# Patient Record
Sex: Female | Born: 1995
Health system: Southern US, Community
[De-identification: ages and names within clinical notes are randomized; demographics above are authoritative.]

## PROBLEM LIST (undated history)

## (undated) DIAGNOSIS — Z973 Presence of spectacles and contact lenses: Secondary | ICD-10-CM

## (undated) DIAGNOSIS — F419 Anxiety disorder, unspecified: Secondary | ICD-10-CM

## (undated) DIAGNOSIS — K8010 Calculus of gallbladder with chronic cholecystitis without obstruction: Secondary | ICD-10-CM

## (undated) DIAGNOSIS — F32A Depression, unspecified: Secondary | ICD-10-CM

## (undated) HISTORY — PX: NO PAST SURGERIES: SHX2092

## (undated) HISTORY — PX: CHOLECYSTECTOMY: SHX55

---
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
## 2019-01-30 ENCOUNTER — Ambulatory Visit: Payer: BLUE CROSS/BLUE SHIELD | Admitting: Family

## 2019-02-16 ENCOUNTER — Other Ambulatory Visit: Payer: Self-pay

## 2019-02-17 ENCOUNTER — Ambulatory Visit: Payer: 59 | Admitting: Family

## 2019-02-17 ENCOUNTER — Encounter: Payer: Self-pay | Admitting: Family

## 2019-02-17 VITALS — BP 128/89 | HR 82 | Temp 98.5°F | Ht 63.0 in | Wt 132.4 lb

## 2019-02-17 DIAGNOSIS — F411 Generalized anxiety disorder: Secondary | ICD-10-CM | POA: Diagnosis not present

## 2019-02-17 DIAGNOSIS — Z0000 Encounter for general adult medical examination without abnormal findings: Secondary | ICD-10-CM

## 2019-02-17 DIAGNOSIS — F330 Major depressive disorder, recurrent, mild: Secondary | ICD-10-CM

## 2019-02-17 MED ORDER — ESCITALOPRAM OXALATE 10 MG PO TABS: 10.0000 mg | ORAL_TABLET | Freq: Every day | ORAL | 3 refills | Status: DC

## 2019-02-17 NOTE — Progress Notes (Signed)
 Subjective:    Patient ID: Denise Davila, female    DOB: 04/05/1996, 23 y.o.   MRN: [id number]  Chief Complaint  Patient presents with  . New Patient (Initial Visit)   PT presents to the office today for CPE. It has been several years since we have seen each other. She states it has been a year and half since she took Lexapro. She states she stopped it because shew as doing well, but over the last few months she has noticed her anxiety has increased and would like to restart.  Anxiety Presents for follow-up visit. Symptoms include decreased concentration, depressed mood, excessive worry, insomnia, irritability, nervous/anxious behavior, panic and restlessness. Symptoms occur most days. The severity of symptoms is moderate.    Depression        This is a chronic problem.  The current episode started more than 1 year ago.   The onset quality is gradual.   The problem occurs intermittently.  The problem has been waxing and waning since onset.  Associated symptoms include decreased concentration, insomnia, irritable, restlessness, decreased interest and sad.  Associated symptoms include no helplessness and no hopelessness.  Past treatments include nothing.  Compliance with treatment is good.  Past medical history includes anxiety.       Review of Systems  Constitutional: Positive for irritability.  Psychiatric/Behavioral: Positive for decreased concentration and depression. The patient is nervous/anxious and has insomnia.   All other systems reviewed and are negative.      Family History  Problem Relation Age of Onset  . Hyperlipidemia Father     Social History   Socioeconomic History  . Marital status: Single    Spouse name: Not on file  . Number of children: Not on file  . Years of education: Not on file  . Highest education level: Not on file  Occupational History  . Not on file  Social Needs  . Financial resource strain: Not on file  . Food insecurity    Worry: Not on  file    Inability: Not on file  . Transportation needs    Medical: Not on file    Non-medical: Not on file  Tobacco Use  . Smoking status: Never Smoker  Substance and Sexual Activity  . Alcohol use: No    Alcohol/week: 0.0 standard drinks  . Drug use: No  . Sexual activity: Not on file  Lifestyle  . Physical activity    Days per week: Not on file    Minutes per session: Not on file  . Stress: Not on file  Relationships  . Social connections    Talks on phone: Not on file    Gets together: Not on file    Attends religious service: Not on file    Active member of club or organization: Not on file    Attends meetings of clubs or organizations: Not on file    Relationship status: Not on file  Other Topics Concern  . Not on file  Social History Narrative  . Not on file    Objective:   Physical Exam Vitals signs reviewed.  Constitutional:      General: She is irritable. She is not in acute distress.    Appearance: She is well-developed.  HENT:     Head: Normocephalic and atraumatic.     Right Ear: Tympanic membrane normal.     Left Ear: Tympanic membrane normal.  Eyes:     Pupils: Pupils are equal, round, and reactive   to light.  Neck:     Musculoskeletal: Normal range of motion and neck supple.     Thyroid: No thyromegaly.  Cardiovascular:     Rate and Rhythm: Normal rate and regular rhythm.     Heart sounds: Normal heart sounds. No murmur.  Pulmonary:     Effort: Pulmonary effort is normal. No respiratory distress.     Breath sounds: Normal breath sounds. No wheezing.  Abdominal:     General: Bowel sounds are normal. There is no distension.     Palpations: Abdomen is soft.     Tenderness: There is no abdominal tenderness.  Musculoskeletal: Normal range of motion.        General: No tenderness.  Skin:    General: Skin is warm and dry.  Neurological:     Mental Status: She is alert and oriented to person, place, and time.     Cranial Nerves: No cranial nerve  deficit.     Deep Tendon Reflexes: Reflexes are normal and symmetric.  Psychiatric:        Behavior: Behavior normal.        Thought Content: Thought content normal.        Judgment: Judgment normal.     BP 128/89   Pulse 82   Temp 98.5 F (36.9 C) (Oral)   Ht 5' 3" (1.6 m)   Wt 132 lb 6.4 oz (60.1 kg)   BMI 23.45 kg/m      Assessment & Plan:  Denise Davila comes in today with chief complaint of New Patient (Initial Visit) (dizzy spells )   Diagnosis and orders addressed:  1. GAD (generalized anxiety disorder) -Will restart patient on Lexapro 10 mg today Stress management discussed Possible adverse effects discussed  - CMP14+EGFR - CBC with Differential/Platelet - escitalopram (LEXAPRO) 10 MG tablet; Take 1 tablet (10 mg total) by mouth daily.  Dispense: 90 tablet; Refill: 3  2. Mild episode of recurrent major depressive disorder (HCC) - CMP14+EGFR - CBC with Differential/Platelet - escitalopram (LEXAPRO) 10 MG tablet; Take 1 tablet (10 mg total) by mouth daily.  Dispense: 90 tablet; Refill: 3  3. Annual physical exam - CMP14+EGFR - CBC with Differential/Platelet - TSH - Lipid panel   Labs pending Health Maintenance reviewed Diet and exercise encouraged  Follow up plan: 4 weeks    Christy Hawks, FNP  

## 2019-02-17 NOTE — Patient Instructions (Signed)

## 2019-02-18 LAB — CBC WITH DIFFERENTIAL/PLATELET
Basophils Absolute: 0.1 10*3/uL (ref 0.0–0.2)
Basos: 1 %
EOS (ABSOLUTE): 0.1 10*3/uL (ref 0.0–0.4)
Eos: 1 %
Hematocrit: 38.4 % (ref 34.0–46.6)
Hemoglobin: 13.3 g/dL (ref 11.1–15.9)
Immature Grans (Abs): 0 10*3/uL (ref 0.0–0.1)
Immature Granulocytes: 0 %
Lymphocytes Absolute: 2.2 10*3/uL (ref 0.7–3.1)
Lymphs: 20 %
MCH: 31.6 pg (ref 26.6–33.0)
MCHC: 34.6 g/dL (ref 31.5–35.7)
MCV: 91 fL (ref 79–97)
Monocytes Absolute: 0.9 10*3/uL (ref 0.1–0.9)
Monocytes: 8 %
Neutrophils Absolute: 7.5 10*3/uL — ABNORMAL HIGH (ref 1.4–7.0)
Neutrophils: 70 %
Platelets: 298 10*3/uL (ref 150–450)
RBC: 4.21 x10E6/uL (ref 3.77–5.28)
RDW: 11.8 % (ref 11.7–15.4)
WBC: 10.8 10*3/uL (ref 3.4–10.8)

## 2019-02-18 LAB — CMP14+EGFR
ALT: 16 IU/L (ref 0–32)
AST: 17 IU/L (ref 0–40)
Albumin/Globulin Ratio: 1.8 (ref 1.2–2.2)
Albumin: 5.1 g/dL — ABNORMAL HIGH (ref 3.9–5.0)
Alkaline Phosphatase: 61 IU/L (ref 39–117)
BUN/Creatinine Ratio: 16 (ref 9–23)
BUN: 12 mg/dL (ref 6–20)
Bilirubin Total: 0.3 mg/dL (ref 0.0–1.2)
CO2: 21 mmol/L (ref 20–29)
Calcium: 9.7 mg/dL (ref 8.7–10.2)
Chloride: 99 mmol/L (ref 96–106)
Creatinine, Ser: 0.73 mg/dL (ref 0.57–1.00)
GFR calc Af Amer: 136 mL/min/{1.73_m2} (ref >59)
GFR calc non Af Amer: 118 mL/min/{1.73_m2} (ref >59)
Globulin, Total: 2.8 g/dL (ref 1.5–4.5)
Glucose: 88 mg/dL (ref 65–99)
Potassium: 4.2 mmol/L (ref 3.5–5.2)
Sodium: 140 mmol/L (ref 134–144)
Total Protein: 7.9 g/dL (ref 6.0–8.5)

## 2019-02-18 LAB — TSH: TSH: 1.5 u[IU]/mL (ref 0.450–4.500)

## 2019-02-18 LAB — LIPID PANEL
Chol/HDL Ratio: 2.1 ratio (ref 0.0–4.4)
Cholesterol, Total: 156 mg/dL (ref 100–199)
HDL: 74 mg/dL (ref >39)
LDL Calculated: 75 mg/dL (ref 0–99)
Triglycerides: 35 mg/dL (ref 0–149)
VLDL Cholesterol Cal: 7 mg/dL (ref 5–40)

## 2019-03-29 ENCOUNTER — Other Ambulatory Visit: Payer: Self-pay

## 2019-03-30 ENCOUNTER — Ambulatory Visit: Payer: 59 | Admitting: Family

## 2019-03-30 ENCOUNTER — Encounter: Payer: Self-pay | Admitting: Family

## 2019-03-30 VITALS — BP 113/75 | HR 72 | Temp 98.6°F | Ht 63.0 in | Wt 129.0 lb

## 2019-03-30 DIAGNOSIS — F411 Generalized anxiety disorder: Secondary | ICD-10-CM | POA: Diagnosis not present

## 2019-03-30 DIAGNOSIS — F330 Major depressive disorder, recurrent, mild: Secondary | ICD-10-CM

## 2019-03-30 MED ORDER — ESCITALOPRAM OXALATE 20 MG PO TABS: 20.0000 mg | ORAL_TABLET | Freq: Every day | ORAL | 5 refills | Status: DC

## 2019-03-30 MED ORDER — BUSPIRONE HCL 7.5 MG PO TABS: 7.5000 mg | ORAL_TABLET | Freq: Three times a day (TID) | ORAL | 1 refills | Status: DC

## 2019-03-30 NOTE — Progress Notes (Signed)
Subjective:    Patient ID: Denise Davila, female    DOB: 1995/12/29, 23 y.o.   MRN: [id number]  Chief Complaint  Patient presents with  . Anxiety    recheck   Pt presents to the office today to recheck GAD and depression. She was seen on 02/17/19 and was started on Lexapro 10 mg. She states she has not seen a difference. She reports she has increased her drinking since feeling so anxious and depressed. She reports drinking about a case a week.  Anxiety Presents for follow-up visit. Symptoms include decreased concentration, depressed mood, excessive worry, irritability, nervous/anxious behavior and restlessness. Patient reports no suicidal ideas. Symptoms occur most days. The severity of symptoms is moderate. The quality of sleep is good.    Depression        This is a chronic problem.  The current episode started more than 1 year ago.   The onset quality is gradual.   The problem occurs intermittently.  Associated symptoms include decreased concentration, helplessness, hopelessness, irritable, restlessness, decreased interest, appetite change and sad.  Associated symptoms include no suicidal ideas.  Past treatments include SSRIs - Selective serotonin reuptake inhibitors.  Past medical history includes anxiety.       Review of Systems  Constitutional: Positive for appetite change and irritability.  Psychiatric/Behavioral: Positive for decreased concentration and depression. Negative for suicidal ideas. The patient is nervous/anxious.   All other systems reviewed and are negative.      Objective:   Physical Exam Vitals signs reviewed.  Constitutional:      General: She is irritable. She is not in acute distress.    Appearance: She is well-developed.  HENT:     Head: Normocephalic and atraumatic.     Right Ear: Tympanic membrane normal.     Left Ear: Tympanic membrane normal.  Eyes:     Pupils: Pupils are equal, round, and reactive to light.  Neck:     Musculoskeletal: Normal  range of motion and neck supple.     Thyroid: No thyromegaly.  Cardiovascular:     Rate and Rhythm: Normal rate and regular rhythm.     Heart sounds: Normal heart sounds. No murmur.  Pulmonary:     Effort: Pulmonary effort is normal. No respiratory distress.     Breath sounds: Normal breath sounds. No wheezing.  Abdominal:     General: Bowel sounds are normal. There is no distension.     Palpations: Abdomen is soft.     Tenderness: There is no abdominal tenderness.  Musculoskeletal: Normal range of motion.        General: No tenderness.  Skin:    General: Skin is warm and dry.  Neurological:     Mental Status: She is alert and oriented to person, place, and time.     Cranial Nerves: No cranial nerve deficit.     Deep Tendon Reflexes: Reflexes are normal and symmetric.  Psychiatric:        Mood and Affect: Affect is flat.        Behavior: Behavior normal.        Thought Content: Thought content normal.        Judgment: Judgment normal.     BP 113/75   Pulse 72   Temp 98.6 F (37 C) (Temporal)   Ht 5\' 3"  (1.6 m)   Wt 129 lb (58.5 kg)   LMP 03/16/2019   BMI 22.85 kg/m      Assessment & Plan:  [employer]  BARABRA TIMP comes in today with chief complaint of Anxiety (recheck)   Diagnosis and orders addressed:  1. Mild episode of recurrent major depressive disorder (HCC) - escitalopram (LEXAPRO) 20 MG tablet; Take 1 tablet (20 mg total) by mouth daily.  Dispense: 30 tablet; Refill: 5 - busPIRone (BUSPAR) 7.5 MG tablet; Take 1 tablet (7.5 mg total) by mouth 3 (three) times daily.  Dispense: 90 tablet; Refill: 1  2. GAD (generalized anxiety disorder) - escitalopram (LEXAPRO) 20 MG tablet; Take 1 tablet (20 mg total) by mouth daily.  Dispense: 30 tablet; Refill: 5 - busPIRone (BUSPAR) 7.5 MG tablet; Take 1 tablet (7.5 mg total) by mouth 3 (three) times daily.  Dispense: 90 tablet; Refill: 1  Will increase Lexapro to 20 mg from 10 mg Start Buspar Discussed decreasing her alcohol  intake and then stopping completely. Her father is an alcoholic  PT wants to hold off on [place] referral at this time RTO in 4 weeks   Evelina Dun, FNP

## 2019-03-30 NOTE — Patient Instructions (Signed)

## 2019-04-14 ENCOUNTER — Other Ambulatory Visit: Payer: Self-pay

## 2019-04-17 ENCOUNTER — Ambulatory Visit: Payer: 59 | Admitting: Family

## 2019-04-19 ENCOUNTER — Encounter: Payer: Self-pay | Admitting: Family

## 2020-06-18 ENCOUNTER — Encounter (HOSPITAL_COMMUNITY): Payer: Self-pay | Admitting: Emergency Medicine

## 2020-06-18 ENCOUNTER — Emergency Department (HOSPITAL_COMMUNITY)
Admission: EM | Admit: 2020-06-18 | Discharge: 2020-06-18 | Disposition: A | Discharge status: Home / Self Care | Payer: 59 | Attending: Emergency Medicine | Admitting: Emergency Medicine

## 2020-06-18 ENCOUNTER — Other Ambulatory Visit: Payer: Self-pay

## 2020-06-18 DIAGNOSIS — J029 Acute pharyngitis, unspecified: Secondary | ICD-10-CM | POA: Diagnosis not present

## 2020-06-18 DIAGNOSIS — Z20822 Contact with and (suspected) exposure to covid-19: Secondary | ICD-10-CM | POA: Insufficient documentation

## 2020-06-18 DIAGNOSIS — R059 Cough, unspecified: Secondary | ICD-10-CM | POA: Diagnosis present

## 2020-06-18 DIAGNOSIS — J209 Acute bronchitis, unspecified: Secondary | ICD-10-CM | POA: Insufficient documentation

## 2020-06-18 LAB — RESP PANEL BY RT-PCR (FLU A&B, COVID) ARPGX2
Influenza A by PCR: NEGATIVE
Influenza B by PCR: NEGATIVE
SARS Coronavirus 2 by RT PCR: NEGATIVE

## 2020-06-18 LAB — GROUP A STREP BY PCR: Group A Strep by PCR: NOT DETECTED

## 2020-06-18 MED ORDER — AZITHROMYCIN 250 MG PO TABS: 250.0000 mg | ORAL_TABLET | Freq: Every day | ORAL | 0 refills | Status: DC

## 2020-06-18 NOTE — Discharge Instructions (Addendum)
You were seen in the emerge department for evaluation of cough sore throat and congestion in the setting of a Covid exposure.  Your Covid and flu testing were negative.  Your rapid strep test was negative.  We are putting you on some antibiotics for possible acute bronchitis.  Please drink plenty of fluids.  Follow-up with your regular primary care doctor.  Return to the emergency department for any worsening or concerning symptoms.

## 2020-06-18 NOTE — ED Provider Notes (Signed)
[facility] DEPT Provider Note   CSN: [id number] Arrival date & time: 06/18/20  1521     History Chief Complaint  Patient presents with  . Covid Exposure    Denise Davila is a 24 y.o. female.  She is complaining of 6 days of sore throat cough chest congestion.  No fever.  Her boyfriend tested positive for Covid and she has not been isolating from him.  She is not Covid vaccinated.  The history is provided by the patient.  Influenza Presenting symptoms: cough, rhinorrhea, shortness of breath and sore throat   Presenting symptoms: no fever and no headaches   Severity:  Moderate Onset quality:  Gradual Duration:  6 days Progression:  Unchanged Chronicity:  New Relieved by:  None tried Worsened by:  Nothing Ineffective treatments:  None tried Associated symptoms: nasal congestion   Associated symptoms: no mental status change and no neck stiffness   Risk factors: sick contacts        History reviewed. No pertinent past medical history.  Patient Active Problem List   Diagnosis Date Noted  . GAD (generalized anxiety disorder) 08/22/2015  . Depression 08/22/2015    History reviewed. No pertinent surgical history.   OB History   No obstetric history on file.     Family History  Problem Relation Age of Onset  . Hyperlipidemia Father     Social History   Tobacco Use  . Smoking status: Never Smoker  . Smokeless tobacco: Never Used  Vaping Use  . Vaping Use: Never used  Substance Use Topics  . Alcohol use: No    Alcohol/week: 0.0 standard drinks  . Drug use: No    Home Medications Prior to Admission medications   Medication Sig Start Date End Date Taking? Authorizing Provider  busPIRone (BUSPAR) 7.5 MG tablet Take 1 tablet (7.5 mg total) by mouth 3 (three) times daily. 03/30/19   Evelina Dun A, FNP  escitalopram (LEXAPRO) 20 MG tablet Take 1 tablet (20 mg total) by mouth daily. 03/30/19   Sharion Balloon, FNP    Allergies     Patient has no known allergies.  Review of Systems   Review of Systems  Constitutional: Negative for fever.  HENT: Positive for congestion, rhinorrhea, sore throat and voice change.   Eyes: Negative for visual disturbance.  Respiratory: Positive for cough and shortness of breath.   Cardiovascular: Negative for chest pain.  Gastrointestinal: Negative for abdominal pain.  Genitourinary: Negative for dysuria.  Musculoskeletal: Negative for neck stiffness.  Skin: Negative for rash.  Neurological: Negative for headaches.    Physical Exam Updated Vital Signs BP 125/79 (BP Location: Left Arm)   Pulse 69   Temp 98.2 F (36.8 C) (Oral)   Resp 16   SpO2 100%   Physical Exam Vitals and nursing note reviewed.  Constitutional:      General: She is not in acute distress.    Appearance: Normal appearance. She is well-developed.  HENT:     Head: Normocephalic and atraumatic.     Mouth/Throat:     Mouth: Mucous membranes are moist.     Pharynx: Oropharynx is clear. No oropharyngeal exudate.  Eyes:     Conjunctiva/sclera: Conjunctivae normal.  Cardiovascular:     Rate and Rhythm: Normal rate and regular rhythm.     Heart sounds: No murmur heard.   Pulmonary:     Effort: Pulmonary effort is normal. No respiratory distress.     Breath sounds: Normal breath sounds.  Abdominal:     Palpations: Abdomen is soft.     Tenderness: There is no abdominal tenderness.  Musculoskeletal:        General: No deformity. Normal range of motion.     Cervical back: Neck supple.  Skin:    General: Skin is warm and dry.     Capillary Refill: Capillary refill takes less than 2 seconds.  Neurological:     General: No focal deficit present.     Mental Status: She is alert.     ED Results / Procedures / Treatments   Labs (all labs ordered are listed, but only abnormal results are displayed) Labs Reviewed  GROUP A STREP BY PCR  RESP PANEL BY RT-PCR (FLU A&B, COVID) [id number]     EKG None  Radiology No results found.  Procedures Procedures (including critical care time)  Medications Ordered in ED Medications - No data to display  ED Course  I have reviewed the triage vital signs and the nursing notes.  Pertinent labs & imaging results that were available during my care of the patient were reviewed by me and considered in my medical decision making (see chart for details).    MDM Rules/Calculators/A&P                         Denise Davila was evaluated in Emergency Department on 06/18/2020 for the symptoms described in the history of present illness. She was evaluated in the context of the global COVID-19 pandemic, which necessitated consideration that the patient might be at risk for infection with the SARS-CoV-2 virus that causes COVID-19. Institutional protocols and algorithms that pertain to the evaluation of patients at risk for COVID-19 are in a state of rapid change based on information released by regulatory bodies including the CDC and federal and state organizations. These policies and algorithms were followed during the patient's care in the ED.  Differential diagnosis includes Covid, bronchitis, pneumonia, PE, reactive airway, strep throat.  Patient's Covid flu and rapid strep are negative.  Vital signs unremarkable and PERC negative.  Will treat for possible bronchitis with antibiotics.  Return instructions discussed Final Clinical Impression(s) / ED Diagnoses Final diagnoses:  Pharyngitis, unspecified etiology  Acute bronchitis, unspecified organism    Rx / DC Orders ED Discharge Orders         Ordered    azithromycin (ZITHROMAX) 250 MG tablet  Daily        06/18/20 1831           Hayden Rasmussen, MD 06/19/20 0930

## 2020-06-18 NOTE — ED Triage Notes (Signed)
Per pt, states she has a sore throat and cough for a couple of days-states her BF tested positive 2 weeks ago-has not been isolating self from BF

## 2020-09-05 ENCOUNTER — Encounter: Payer: Self-pay | Admitting: Family Medicine

## 2020-09-05 ENCOUNTER — Other Ambulatory Visit: Payer: Self-pay

## 2020-09-05 ENCOUNTER — Ambulatory Visit: Payer: 59 | Admitting: Family Medicine

## 2020-09-05 VITALS — BP 115/79 | HR 86 | Temp 99.1°F | Ht 63.0 in | Wt 126.4 lb

## 2020-09-05 DIAGNOSIS — K8020 Calculus of gallbladder without cholecystitis without obstruction: Secondary | ICD-10-CM | POA: Diagnosis not present

## 2020-09-05 DIAGNOSIS — R1011 Right upper quadrant pain: Secondary | ICD-10-CM

## 2020-09-05 NOTE — Patient Instructions (Signed)
Gallbladder Eating Plan If you have a gallbladder condition, you may have trouble digesting fats. Eating a low-fat diet can help reduce your symptoms, and may be helpful before and after having surgery to remove your gallbladder (cholecystectomy). Your health care provider may recommend that you work with a diet and nutrition specialist (dietitian) to help you reduce the amount of fat in your diet. What are tips for following this plan? General guidelines  Limit your fat intake to less than 30% of your total daily calories. If you eat around 1,800 calories each day, this is less than 60 grams (g) of fat per day.  Fat is an important part of a healthy diet. Eating a low-fat diet can make it hard to maintain a healthy body weight. Ask your dietitian how much fat, calories, and other nutrients you need each day.  Eat small, frequent meals throughout the day instead of three large meals.  Drink at least 8-10 cups of fluid a day. Drink enough fluid to keep your urine clear or pale yellow.  Limit alcohol intake to no more than 1 drink a day for nonpregnant women and 2 drinks a day for men. One drink equals 12 oz of beer, 5 oz of wine, or 1 oz of hard liquor. Reading food labels  Check Nutrition Facts on food labels for the amount of fat per serving. Choose foods with less than 3 grams of fat per serving.   Shopping  Choose nonfat and low-fat healthy foods. Look for the words "nonfat," "low fat," or "fat free."  Avoid buying processed or prepackaged foods. Cooking  Cook using low-fat methods, such as baking, broiling, grilling, or boiling.  Cook with small amounts of healthy fats, such as olive oil, grapeseed oil, canola oil, or sunflower oil. What foods are recommended?  All fresh, frozen, or canned fruits and vegetables.  Whole grains.  Low-fat or non-fat (skim) milk and yogurt.  Lean meat, skinless poultry, fish, eggs, and beans.  Low-fat protein supplement powders or  drinks.  Spices and herbs. What foods are not recommended?  High-fat foods. These include baked goods, fast food, fatty cuts of meat, ice cream, french toast, sweet rolls, pizza, cheese bread, foods covered with butter, creamy sauces, or cheese.  Fried foods. These include french fries, tempura, battered fish, breaded chicken, fried breads, and sweets.  Foods with strong odors.  Foods that cause bloating and gas. Summary  A low-fat diet can be helpful if you have a gallbladder condition, or before and after gallbladder surgery.  Limit your fat intake to less than 30% of your total daily calories. This is about 60 g of fat if you eat 1,800 calories each day.  Eat small, frequent meals throughout the day instead of three large meals. This information is not intended to replace advice given to you by your health care provider. Make sure you discuss any questions you have with your health care provider. Document Revised: 02/29/2020 Document Reviewed: 02/29/2020 Elsevier Patient Education  2021 [employer]  

## 2020-09-05 NOTE — Progress Notes (Signed)
Established Patient Office Visit  Subjective:  Patient ID: Denise Davila, female    DOB: Jan 01, 1996  Age: 25 y.o. MRN: [id number]  CC:  Chief Complaint  Patient presents with  . Abdominal Pain    Right upper quadrant     HPI [employer] presents for abdominal pain. She has been to the ED x3 for this since January. She reports a history of gall stones and had an US done in the ED on 08/02/20. Gall stones were noted but no evidence of cholelithiasis on [place]. She was referred to general surgery but has received a phone call regarding this. She has been prescribed pepcid, zofran, protonix, and carafate for gastritis as well. These have been helpful. She drinks a case of beer and a 1/5th of liquor a week. She denies vomiting, diarrhea, fever, blood in her stool, weakness, or dizziness. She has been trying to watch her diet. Labs were completed in the ED on 09/02/20 and were unremarkable. She denies a change in symptoms since then.    History reviewed. No pertinent past medical history.  History reviewed. No pertinent surgical history.  Family History  Problem Relation Age of Onset  . Hyperlipidemia Father     Social History   Socioeconomic History  . Marital status: Single    Spouse name: Not on file  . Number of children: 0  . Years of education: Not on file  . Highest education level: Not on file  Occupational History  . Occupation: Hospital doctor    Comment: [place] [employer]  Tobacco Use  . Smoking status: Never Smoker  . Smokeless tobacco: Never Used  Vaping Use  . Vaping Use: Every day  Substance and Sexual Activity  . Alcohol use: Yes    Alcohol/week: 24.0 standard drinks    Types: 24 Cans of beer per week    Comment: 5th of liquor  . Drug use: No  . Sexual activity: Not on file  Other Topics Concern  . Not on file  Social History Narrative  . Not on file   Social Determinants of Health   Financial Resource Strain: Not on file  Food Insecurity: Not on file   Transportation Needs: Not on file  Physical Activity: Not on file  Stress: Not on file  Social Connections: Not on file  Intimate Partner Violence: Not on file    Outpatient Medications Prior to Visit  Medication Sig Dispense Refill  . famotidine (PEPCID) 20 MG tablet Take by mouth.    . ondansetron (ZOFRAN-ODT) 4 MG disintegrating tablet Take by mouth.    . pantoprazole (PROTONIX) 40 MG tablet Take by mouth.    . sucralfate (CARAFATE) 1 g tablet Take by mouth.    Marland Kitchen azithromycin (ZITHROMAX) 250 MG tablet Take 1 tablet (250 mg total) by mouth daily. Take first 2 tablets together, then 1 every day until finished. 6 tablet 0  . busPIRone (BUSPAR) 7.5 MG tablet Take 1 tablet (7.5 mg total) by mouth 3 (three) times daily. 90 tablet 1  . escitalopram (LEXAPRO) 20 MG tablet Take 1 tablet (20 mg total) by mouth daily. 30 tablet 5   No facility-administered medications prior to visit.    No Known Allergies  ROS Review of Systems As per HPI.   Objective:    Physical Exam Vitals and nursing note reviewed.  Constitutional:      General: She is not in acute distress.    Appearance: She is well-developed. She is not ill-appearing, toxic-appearing or  diaphoretic.  Eyes:     General: No scleral icterus. Cardiovascular:     Rate and Rhythm: Normal rate and regular rhythm.     Heart sounds: Normal heart sounds. No murmur heard.   Pulmonary:     Effort: Pulmonary effort is normal. No respiratory distress.     Breath sounds: Normal breath sounds.  Abdominal:     General: Abdomen is flat. There is no distension.     Palpations: Abdomen is soft. There is no shifting dullness, fluid wave, hepatomegaly, splenomegaly, mass or pulsatile mass.     Tenderness: There is abdominal tenderness. There is no right CVA tenderness, left CVA tenderness, guarding or rebound. Positive signs include Murphy's sign. Negative signs include Rovsing's sign and McBurney's sign.  Skin:    General: Skin is warm  and dry.     Coloration: Skin is not jaundiced.  Neurological:     General: No focal deficit present.     Mental Status: She is alert and oriented to person, place, and time.  Psychiatric:        Mood and Affect: Mood normal.        Behavior: Behavior normal.     BP 115/79   Pulse 86   Temp 99.1 F (37.3 C)   Ht 5\' 3"  (1.6 m)   Wt 126 lb 6.4 oz (57.3 kg)   SpO2 100%   BMI 22.39 kg/m  Wt Readings from Last 3 Encounters:  09/05/20 126 lb 6.4 oz (57.3 kg)  03/30/19 129 lb (58.5 kg)  02/17/19 132 lb 6.4 oz (60.1 kg)     Health Maintenance Due  Topic Date Due  . Hepatitis C Screening  Never done  . HIV Screening  Never done  . PAP-Cervical Cytology Screening  Never done  . PAP SMEAR-Modifier  Never done    There are no preventive care reminders to display for this patient.  Lab Results  Component Value Date   TSH 1.500 02/17/2019   Lab Results  Component Value Date   WBC 10.8 02/17/2019   HGB 13.3 02/17/2019   HCT 38.4 02/17/2019   MCV 91 02/17/2019   PLT 298 02/17/2019   Lab Results  Component Value Date   NA 140 02/17/2019   K 4.2 02/17/2019   CO2 21 02/17/2019   GLUCOSE 88 02/17/2019   BUN 12 02/17/2019   CREATININE 0.73 02/17/2019   BILITOT 0.3 02/17/2019   ALKPHOS 61 02/17/2019   AST 17 02/17/2019   ALT 16 02/17/2019   PROT 7.9 02/17/2019   ALBUMIN 5.1 (H) 02/17/2019   CALCIUM 9.7 02/17/2019   Lab Results  Component Value Date   CHOL 156 02/17/2019   Lab Results  Component Value Date   HDL 74 02/17/2019   Lab Results  Component Value Date   LDLCALC 75 02/17/2019   Lab Results  Component Value Date   TRIG 35 02/17/2019   Lab Results  Component Value Date   CHOLHDL 2.1 02/17/2019   No results found for: HGBA1C    Assessment & Plan:   Denise Davila was seen today for abdominal pain.  Diagnoses and all orders for this visit:  Right upper quadrant pain [place] on 08/02/20 in ED showed gall stones without evidence of cholelithiasis. Referral  was placed by ED but patient has not received notification for this. Labs in ED on 09/02/20 were unremarkable. Continue Pepcid, protonix, and carafate as prescribed. Discussed reduction in alcohol. New referral placed. Return to office for new or worsening  symptoms, or if symptoms persist.  -     Ambulatory referral to [facility] stones New referral to General Surgery placed for patient. Diet handout given.  -     Ambulatory referral to General Surgery  Follow-up: Return if symptoms worsen or fail to improve.   The patient indicates understanding of these issues and agrees with the plan.  Gwenlyn Perking, FNP

## 2020-10-29 ENCOUNTER — Ambulatory Visit: Payer: Self-pay | Admitting: Surgery

## 2020-10-29 DIAGNOSIS — K8010 Calculus of gallbladder with chronic cholecystitis without obstruction: Secondary | ICD-10-CM | POA: Insufficient documentation

## 2020-10-29 NOTE — H&P (Signed)
Denise Davila Appointment: 10/29/2020 11:00 AM Location: [facility] Patient #: [id number] DOB: [birth year] Single / Language: Cleophus Molt / Race: White Female  History of Present Illness Denise Hector MD; 10/29/2020 12:41 PM) The patient is a 25 year old female who presents for evaluation of gall stones. Note for "Gall stones": ` ` ` Patient sent for surgical consultation at the request of Marjorie Smolder, NP  Chief Complaint: Upper abdominal pain with gallstones. ` ` The patient is a patient is had intermittent episodes of upper abdominal pain. Has gone to the peripheral emergency room several times. Had an ultrasound that revealed gallstones in the description but in the assessments is no evidence. I cannot pull up the films. Without cholecystitis. Moderate alcohol intake as a trigger as well. Follow-up with primary care. Suspicion for possible cholecystitis and not just alcoholic gastritis. Surgical consultation offered.  Patient comes in today with her significant other. She describes pain in the right upper quadrant. Usually triggered by eating. Occasionally spicy foods. Worse with the onion rings. She does have some moderate alcohol intake but does not know if that triggered things. She is gone to the emergency room 3 times. She's never had any prior abdominal surgery. She can walk several miles without difficulty. No sleep apnea. No diabetes. No tobacco. She has been given Carafate, Pepcid, Protonix. I don't know how compliant she's been on it but it sounds like she's been taking medications for several weeks. She noted a little less discomfort but still with repeated attacks. Denies any heartburn and supine or bending over. No burping or belching. No early satiety. No history of prior ulcerations. Does not take frequent doses of nonsteroidals. She has some moderate alcohol intake but no heavy binging.  No personal nor family history of GI/colon cancer,  inflammatory bowel disease, irritable bowel syndrome, allergy such as Celiac Sprue, dietary/dairy problems, colitis, ulcers nor gastritis. No recent sick contacts/gastroenteritis. No travel outside the country. No changes in diet. No dysphagia to solids or liquids. No significant heartburn or reflux. No melena, hematemesis, coffee ground emesis. No evidence of prior gastric/peptic ulceration.  (Review of systems as stated in this history (HPI) or in the review of systems. Otherwise all other 12 point ROS are negative) ` ` ###########################################`  IMPRESSION:  No evidence for cholelithiasis.   Electronically Signed by: Zettie Pho  Narrative Performed by [phone] <epic:EPIC?[id number],DXR,[id number],DXR&#14;narrative&#[id number]> COMPARISON: None.  INDICATION: Right Upper Quadrant Pain  TECHNIQUE: [place] RUQ  FINDINGS:  Aorta: Unremarkable.  Inferior vena cava: Unremarkable.  Pancreas: Partially visualized.  Liver: Unremarkable echogenicity. No suspicious hepatic mass identified. Normal size.  Portal vein: Flow is hepatopedal.  Gallbladder: Mildly distended gallbladder with gallstones. Gallbladder wall thickness 2.3 mm. Negative pericholecystic fluid. Indeterminate sonographic Murphy's sign.  Biliary ducts: Common Bile Duct 3.1 mm.  Right kidney: No hydronephrosis. 9.6 cm  Other: NA  Procedure Note  Zettie Pho, MD - 08/02/2020 Formatting of this note might be different from the original. COMPARISON: None.  INDICATION: Right Upper Quadrant Pain  TECHNIQUE: [place] RUQ  FINDINGS:  Aorta: Unremarkable.  Inferior vena cava: Unremarkable.  Pancreas: Partially visualized.  Liver: Unremarkable echogenicity. No suspicious hepatic mass identified. Normal size.  Portal vein: Flow is hepatopedal.  Gallbladder: Mildly distended gallbladder with gallstones. Gallbladder  wall thickness 2.3 mm. Negative pericholecystic fluid. Indeterminate sonographic Murphy's sign.  Biliary ducts: Common Bile Duct 3.1 mm.  Right kidney: No hydronephrosis. 9.6 cm  Other: NA    IMPRESSION:  No evidence for cholelithiasis.  Electronically Signed by: Zettie Pho Exam End: 08/02/20 6:14 AM Specimen Collected: 08/02/20 6:31 AM       This patient encounter took 35 minutes today to perform the following: obtain history, perform exam, review outside records, interpret tests & imaging, counsel the patient on their diagnosis; and, document this encounter, including findings & plan in the electronic health record (EHR).   Past Surgical History Janeann Forehand, CNA; 10/29/2020 11:01 AM) No pertinent past surgical history  Diagnostic Studies History Janeann Forehand, CNA; 10/29/2020 11:01 AM) Colonoscopy never Pap Smear never  Allergies Janeann Forehand, CNA; 10/29/2020 11:01 AM) No Known Drug Allergies [10/29/2020]: Allergies Reconciled  Medication History Janeann Forehand, CNA; 10/29/2020 11:02 AM) Famotidine (20MG  Tablet, Oral) Active. Sucralfate (1GM Tablet, Oral) Active. Pantoprazole Sodium (40MG  Tablet DR, Oral) Active. Medications Reconciled  Social History Janeann Forehand, CNA; 10/29/2020 11:01 AM) Alcohol use Moderate alcohol use. Caffeine use Carbonated beverages. No drug use Tobacco use Former smoker.  Family History Janeann Forehand, CNA; 10/29/2020 11:01 AM) First Degree Relatives No pertinent family history  Pregnancy / Birth History Janeann Forehand, CNA; 10/29/2020 11:01 AM) Age at menarche 61 years. Gravida 0 Para 0 Regular periods  Other Problems Janeann Forehand, CNA; 10/29/2020 11:01 AM) Anxiety Disorder     Review of Systems (Donyelle Alston CNA; 10/29/2020 11:01 AM) General Not Present- Appetite Loss, Chills, Fatigue, Fever, Night Sweats, Weight Gain and Weight Loss. HEENT Present- Wears glasses/contact  lenses. Not Present- Earache, Hearing Loss, Hoarseness, Nose Bleed, Oral Ulcers, Ringing in the Ears, Seasonal Allergies, Sinus Pain, Sore Throat, Visual Disturbances and Yellow Eyes. Respiratory Not Present- Bloody sputum, Chronic Cough, Difficulty Breathing, Snoring and Wheezing. Breast Not Present- Breast Mass, Breast Pain, Nipple Discharge and Skin Changes. Cardiovascular Not Present- Chest Pain, Difficulty Breathing Lying Down, Leg Cramps, Palpitations, Rapid Heart Rate, Shortness of Breath and Swelling of Extremities. Gastrointestinal Not Present- Abdominal Pain, Bloating, Bloody Stool, Change in Bowel Habits, Chronic diarrhea, Constipation, Difficulty Swallowing, Excessive gas, Gets full quickly at meals, Hemorrhoids, Indigestion, Nausea, Rectal Pain and Vomiting. Female Genitourinary Not Present- Frequency, Nocturia, Painful Urination, Pelvic Pain and Urgency. Musculoskeletal Not Present- Back Pain, Joint Pain, Joint Stiffness, Muscle Pain, Muscle Weakness and Swelling of Extremities. Neurological Not Present- Decreased Memory, Fainting, Headaches, Numbness, Seizures, Tingling, Tremor, Trouble walking and Weakness. Endocrine Not Present- Cold Intolerance, Excessive Hunger, Hair Changes, Heat Intolerance, Hot flashes and New Diabetes.  Vitals (Donyelle Alston CNA; 10/29/2020 11:02 AM) 10/29/2020 11:02 AM Weight: 121.5 lb Height: 63in Body Surface Area: 1.56 m Body Mass Index: 21.52 kg/m  Temp.: 98.24F  Pulse: 87 (Regular)  P.OX: 98% (Room air) BP: 118/80(Sitting, Left Arm, Standard)        Physical Exam Denise Hector MD; 10/29/2020 12:36 PM)  General Mental Status-Alert. General Appearance-Not in acute distress, Not Sickly. Orientation-Oriented X3. Hydration-Well hydrated. Voice-Normal. Note: Thin woman. Not cachectic. Rather stoic. Not toxic. Not sickly.  Integumentary Global Assessment Upon inspection and palpation of skin surfaces of the - Axillae:  non-tender, no inflammation or ulceration, no drainage. and Distribution of scalp and body hair is normal. General Characteristics Temperature - normal warmth is noted.  Head and Neck Head-normocephalic, atraumatic with no lesions or palpable masses. Face Global Assessment - atraumatic, no absence of expression. Neck Global Assessment - no abnormal movements, no bruit auscultated on the right, no bruit auscultated on the left, no decreased range of motion, non-tender. Trachea-midline. Thyroid Gland Characteristics - non-tender.  Eye Eyeball - Left-Extraocular movements intact, No Nystagmus - Left. Eyeball - Right-Extraocular movements  intact, No Nystagmus - Right. Cornea - Left-No Hazy - Left. Cornea - Right-No Hazy - Right. Sclera/Conjunctiva - Left-No scleral icterus, No Discharge - Left. Sclera/Conjunctiva - Right-No scleral icterus, No Discharge - Right. Pupil - Left-Direct reaction to light normal. Pupil - Right-Direct reaction to light normal.  ENMT Ears Pinna - Left - no drainage observed, no generalized tenderness observed. Pinna - Right - no drainage observed, no generalized tenderness observed. Nose and Sinuses External Inspection of the Nose - no destructive lesion observed. Inspection of the nares - Left - quiet respiration. Inspection of the nares - Right - quiet respiration. Mouth and Throat Lips - Upper Lip - no fissures observed, no pallor noted. Lower Lip - no fissures observed, no pallor noted. Nasopharynx - no discharge present. Oral Cavity/Oropharynx - Tongue - no dryness observed. Oral Mucosa - no cyanosis observed. Hypopharynx - no evidence of airway distress observed.  Chest and Lung Exam Inspection Movements - Normal and Symmetrical. Accessory muscles - No use of accessory muscles in breathing. Palpation Palpation of the chest reveals - Non-tender. Auscultation Breath sounds - Normal and Clear.  Cardiovascular Auscultation Rhythm -  Regular. Murmurs & Other Heart Sounds - Auscultation of the heart reveals - No Murmurs and No Systolic Clicks.  Abdomen Inspection Inspection of the abdomen reveals - No Visible peristalsis and No Abnormal pulsations. Umbilicus - No Bleeding, No Urine drainage. Palpation/Percussion Palpation and Percussion of the abdomen reveal - Soft, Non Tender, No Rebound tenderness, No Rigidity (guarding) and No Cutaneous hyperesthesia. Note: Abdomen soft. Mild discomfort right upper quadrant at anterior surgery line. No Murphy sign. No epigastric or upper abdominal discomfort elsewhere. No renal mass. No hepatomegaly. No splenomegaly.  Not distended. No diastasis recti. No umbilical or other anterior abdominal wall hernias  Female Genitourinary Sexual Maturity Tanner 5 - Adult hair pattern. Note: No vaginal bleeding nor discharge  Peripheral Vascular Upper Extremity Inspection - Left - No Cyanotic nailbeds - Left, Not Ischemic. Inspection - Right - No Cyanotic nailbeds - Right, Not Ischemic.  Neurologic Neurologic evaluation reveals -normal attention span and ability to concentrate, able to name objects and repeat phrases. Appropriate fund of knowledge , normal sensation and normal coordination. Mental Status Affect - not angry, not paranoid. Cranial Nerves-Normal Bilaterally. Gait-Normal.  Neuropsychiatric Mental status exam performed with findings of-able to articulate well with normal speech/language, rate, volume and coherence, thought content normal with ability to perform basic computations and apply abstract reasoning and no evidence of hallucinations, delusions, obsessions or homicidal/suicidal ideation.  Musculoskeletal Global Assessment Spine, Ribs and Pelvis - no instability, subluxation or laxity. Right Upper Extremity - no instability, subluxation or laxity.  Lymphatic Head & Neck  General Head & Neck Lymphatics: Bilateral - Description - No Localized  lymphadenopathy. Axillary  General Axillary Region: Bilateral - Description - No Localized lymphadenopathy. Femoral & Inguinal  Generalized Femoral & Inguinal Lymphatics: Left - Description - No Localized lymphadenopathy. Right - Description - No Localized lymphadenopathy.    Assessment & Plan Denise Hector MD; 10/29/2020 12:38 PM)  CHRONIC CHOLECYSTITIS WITH CALCULUS (K80.10) Impression: Rather classic story of biliary colic with ultrasound noting stones in the report itself. The rest of the differential diagnosis seems unlikely. No real improvement in symptoms with compliance on antacid regimen and diet adjustment for the past few weeks.  I think she would benefit from cholecystectomy. Reasonable outpatient surgery candidate. Single site approach. She and her significant other are interested in proceeding. I think her risks of surgery are low.  The biggest risk is that it does not solve her problem there are some other etiology for her pain and symptoms. However the differential diagnosis seems underwhelming. I am skeptical and recommending gastroenterology evaluation in the absence of any other GI symptoms. She has gone to the ER 3 times with this, no improvement with medications for gastritis/antacids., so she wishes to be aggressive and proceed with surgery to break the cycle of attacks  Current Plans You are being scheduled for surgery- Our schedulers will call you.  You should hear from our office's scheduling department within 5 working days about the location, date, and time of surgery. We try to make accommodations for patient's preferences in scheduling surgery, but sometimes the OR schedule or the surgeon's schedule prevents [place] from making those accommodations.  If you have not heard from our office [phone]) in 5 working days, call the office and ask for your surgeon's nurse.  If you have other questions about your diagnosis, plan, or surgery, call the office and ask for  your surgeon's nurse.  Written instructions provided Pt Education - Pamphlet Given - Laparoscopic Gallbladder Surgery: discussed with patient and provided information. The anatomy & physiology of hepatobiliary & pancreatic function was discussed. The pathophysiology of gallbladder dysfunction was discussed. Natural history risks without surgery was discussed. I feel the risks of no intervention will lead to serious problems that outweigh the operative risks; therefore, I recommended cholecystectomy to remove the pathology. I explained laparoscopic techniques with possible need for an open approach. Probable cholangiogram to evaluate the bilary tract was explained as well.  Risks such as bleeding, infection, abscess, leak, injury to other organs, need for further treatment, heart attack, death, and other risks were discussed. I noted a good likelihood this will help address the problem. Possibility that this will not correct all abdominal symptoms was explained. Goals of post-operative recovery were discussed as well. We will work to minimize complications. An educational handout further explaining the pathology and treatment options was given as well. Questions were answered. The patient expresses understanding & wishes to proceed with surgery.  Pt Education - [facility] Laparosopic Post Op HCI (Silvana Holecek) Pt Education - [facility] Good Bowel Health (Kelina Beauchamp) Pt Education - Laparoscopic Cholecystectomy: gallbladder  Denise Hector, MD, FACS, MASCRS  Esophageal, Gastrointestinal & Colorectal Surgery Robotic and Minimally Invasive Surgery [facility] [address] [place], [place] [address] [phone] Fax [phone] Main/Paging  CONTACT INFORMATION: Weekday (9AM-5PM) concerns: Call [facility] main office at [phone] Weeknight (5PM-9AM) or Weekend/Holiday concerns: Check [email or web address] for General Surgery [facility] coverage (Please, do not use SecureChat as it is not  reliable communication to operating surgeons for immediate patient care)

## 2020-10-29 NOTE — H&P (View-Only) (Signed)
Denise Davila Appointment: 10/29/2020 11:00 AM Location: [facility] Patient #: [id number] DOB: 10/29/1995 Single / Language: Denise Davila / Race: White Female  History of Present Illness Denise Hector MD; 10/29/2020 12:41 PM) The patient is a 25 year old female who presents for evaluation of gall stones. Note for "Gall stones": ` ` ` Patient sent for surgical consultation at the request of Denise Smolder, NP  Chief Complaint: Upper abdominal pain with gallstones. ` ` The patient is a patient is had intermittent episodes of upper abdominal pain. Has gone to the peripheral emergency room several times. Had an ultrasound that revealed gallstones in the description but in the assessments is no evidence. I cannot pull up the films. Without cholecystitis. Moderate alcohol intake as a trigger as well. Follow-up with primary care. Suspicion for possible cholecystitis and not just alcoholic gastritis. Surgical consultation offered.  Patient comes in today with her significant other. She describes pain in the right upper quadrant. Usually triggered by eating. Occasionally spicy foods. Worse with the onion rings. She does have some moderate alcohol intake but does not know if that triggered things. She is gone to the emergency room 3 times. She's never had any prior abdominal surgery. She can walk several miles without difficulty. No sleep apnea. No diabetes. No tobacco. She has been given Carafate, Pepcid, Protonix. I don't know how compliant she's been on it but it sounds like she's been taking medications for several weeks. She noted a little less discomfort but still with repeated attacks. Denies any heartburn and supine or bending over. No burping or belching. No early satiety. No history of prior ulcerations. Does not take frequent doses of nonsteroidals. She has some moderate alcohol intake but no heavy binging.  No personal nor family history of GI/colon cancer,  inflammatory bowel disease, irritable bowel syndrome, allergy such as Celiac Sprue, dietary/dairy problems, colitis, ulcers nor gastritis. No recent sick contacts/gastroenteritis. No travel outside the country. No changes in diet. No dysphagia to solids or liquids. No significant heartburn or reflux. No melena, hematemesis, coffee ground emesis. No evidence of prior gastric/peptic ulceration.  (Review of systems as stated in this history (HPI) or in the review of systems. Otherwise all other 12 point ROS are negative) ` ` ###########################################`  IMPRESSION:  No evidence for cholelithiasis.   Electronically Signed by: Denise Davila  Narrative Performed by [phone] <epic:EPIC?REPORT&MR_[id number]&11^[id number],DXR,[id number],DXR&#14;narrative&#14;[id number]> COMPARISON: None.  INDICATION: Right Upper Quadrant Pain  TECHNIQUE: [place] RUQ  FINDINGS:  Aorta: Unremarkable.  Inferior vena cava: Unremarkable.  Pancreas: Partially visualized.  Liver: Unremarkable echogenicity. No suspicious hepatic mass identified. Normal size.  Portal vein: Flow is hepatopedal.  Gallbladder: Mildly distended gallbladder with gallstones. Gallbladder wall thickness 2.3 mm. Negative pericholecystic fluid. Indeterminate sonographic Murphy's sign.  Biliary ducts: Common Bile Duct 3.1 mm.  Right kidney: No hydronephrosis. 9.6 cm  Other: NA  Procedure Note  Denise Pho, MD - 08/02/2020 Formatting of this note might be different from the original. COMPARISON: None.  INDICATION: Right Upper Quadrant Pain  TECHNIQUE: [place] RUQ  FINDINGS:  Aorta: Unremarkable.  Inferior vena cava: Unremarkable.  Pancreas: Partially visualized.  Liver: Unremarkable echogenicity. No suspicious hepatic mass identified. Normal size.  Portal vein: Flow is hepatopedal.  Gallbladder: Mildly distended gallbladder with gallstones. Gallbladder  wall thickness 2.3 mm. Negative pericholecystic fluid. Indeterminate sonographic Murphy's sign.  Biliary ducts: Common Bile Duct 3.1 mm.  Right kidney: No hydronephrosis. 9.6 cm  Other: NA    IMPRESSION:  No evidence for cholelithiasis.  Electronically Signed by: Denise Davila Exam End: 08/02/20 6:14 AM Specimen Collected: 08/02/20 6:31 AM       This patient encounter took 35 minutes today to perform the following: obtain history, perform exam, review outside records, interpret tests & imaging, counsel the patient on their diagnosis; and, document this encounter, including findings & plan in the electronic health record (EHR).   Past Surgical History Denise Forehand, CNA; 10/29/2020 11:01 AM) No pertinent past surgical history  Diagnostic Studies History Denise Forehand, CNA; 10/29/2020 11:01 AM) Colonoscopy never Pap Smear never  Allergies Denise Forehand, CNA; 10/29/2020 11:01 AM) No Known Drug Allergies [10/29/2020]: Allergies Reconciled  Medication History Denise Forehand, CNA; 10/29/2020 11:02 AM) Famotidine (20MG  Tablet, Oral) Active. Sucralfate (1GM Tablet, Oral) Active. Pantoprazole Sodium (40MG  Tablet DR, Oral) Active. Medications Reconciled  Social History Denise Forehand, CNA; 10/29/2020 11:01 AM) Alcohol use Moderate alcohol use. Caffeine use Carbonated beverages. No drug use Tobacco use Former smoker.  Family History Denise Forehand, CNA; 10/29/2020 11:01 AM) First Degree Relatives No pertinent family history  Pregnancy / Birth History Denise Forehand, CNA; 10/29/2020 11:01 AM) Age at menarche 68 years. Gravida 0 Para 0 Regular periods  Other Problems Denise Forehand, CNA; 10/29/2020 11:01 AM) Anxiety Disorder     Review of Systems (Denise Alston CNA; 10/29/2020 11:01 AM) General Not Present- Appetite Loss, Chills, Fatigue, Fever, Night Sweats, Weight Gain and Weight Loss. HEENT Present- Wears glasses/contact  lenses. Not Present- Earache, Hearing Loss, Hoarseness, Nose Bleed, Oral Ulcers, Ringing in the Ears, Seasonal Allergies, Sinus Pain, Sore Throat, Visual Disturbances and Yellow Eyes. Respiratory Not Present- Bloody sputum, Chronic Cough, Difficulty Breathing, Snoring and Wheezing. Breast Not Present- Breast Mass, Breast Pain, Nipple Discharge and Skin Changes. Cardiovascular Not Present- Chest Pain, Difficulty Breathing Lying Down, Leg Cramps, Palpitations, Rapid Heart Rate, Shortness of Breath and Swelling of Extremities. Gastrointestinal Not Present- Abdominal Pain, Bloating, Bloody Stool, Change in Bowel Habits, Chronic diarrhea, Constipation, Difficulty Swallowing, Excessive gas, Gets full quickly at meals, Hemorrhoids, Indigestion, Nausea, Rectal Pain and Vomiting. Female Genitourinary Not Present- Frequency, Nocturia, Painful Urination, Pelvic Pain and Urgency. Musculoskeletal Not Present- Back Pain, Joint Pain, Joint Stiffness, Muscle Pain, Muscle Weakness and Swelling of Extremities. Neurological Not Present- Decreased Memory, Fainting, Headaches, Numbness, Seizures, Tingling, Tremor, Trouble walking and Weakness. Endocrine Not Present- Cold Intolerance, Excessive Hunger, Hair Changes, Heat Intolerance, Hot flashes and New Diabetes.  Vitals (Denise Alston CNA; 10/29/2020 11:02 AM) 10/29/2020 11:02 AM Weight: 121.5 lb Height: 63in Body Surface Area: 1.56 m Body Mass Index: 21.52 kg/m  Temp.: 98.81F  Pulse: 87 (Regular)  P.OX: 98% (Room air) BP: 118/80(Sitting, Left Arm, Standard)        Physical Exam Denise Hector MD; 10/29/2020 12:36 PM)  General Mental Status-Alert. General Appearance-Not in acute distress, Not Sickly. Orientation-Oriented X3. Hydration-Well hydrated. Voice-Normal. Note: Thin woman. Not cachectic. Rather stoic. Not toxic. Not sickly.  Integumentary Global Assessment Upon inspection and palpation of skin surfaces of the - Axillae:  non-tender, no inflammation or ulceration, no drainage. and Distribution of scalp and body hair is normal. General Characteristics Temperature - normal warmth is noted.  Head and Neck Head-normocephalic, atraumatic with no lesions or palpable masses. Face Global Assessment - atraumatic, no absence of expression. Neck Global Assessment - no abnormal movements, no bruit auscultated on the right, no bruit auscultated on the left, no decreased range of motion, non-tender. Trachea-midline. Thyroid Gland Characteristics - non-tender.  Eye Eyeball - Left-Extraocular movements intact, No Nystagmus - Left. Eyeball - Right-Extraocular movements  intact, No Nystagmus - Right. Cornea - Left-No Hazy - Left. Cornea - Right-No Hazy - Right. Sclera/Conjunctiva - Left-No scleral icterus, No Discharge - Left. Sclera/Conjunctiva - Right-No scleral icterus, No Discharge - Right. Pupil - Left-Direct reaction to light normal. Pupil - Right-Direct reaction to light normal.  ENMT Ears Pinna - Left - no drainage observed, no generalized tenderness observed. Pinna - Right - no drainage observed, no generalized tenderness observed. Nose and Sinuses External Inspection of the Nose - no destructive lesion observed. Inspection of the nares - Left - quiet respiration. Inspection of the nares - Right - quiet respiration. Mouth and Throat Lips - Upper Lip - no fissures observed, no pallor noted. Lower Lip - no fissures observed, no pallor noted. Nasopharynx - no discharge present. Oral Cavity/Oropharynx - Tongue - no dryness observed. Oral Mucosa - no cyanosis observed. Hypopharynx - no evidence of airway distress observed.  Chest and Lung Exam Inspection Movements - Normal and Symmetrical. Accessory muscles - No use of accessory muscles in breathing. Palpation Palpation of the chest reveals - Non-tender. Auscultation Breath sounds - Normal and Clear.  Cardiovascular Auscultation Rhythm -  Regular. Murmurs & Other Heart Sounds - Auscultation of the heart reveals - No Murmurs and No Systolic Clicks.  Abdomen Inspection Inspection of the abdomen reveals - No Visible peristalsis and No Abnormal pulsations. Umbilicus - No Bleeding, No Urine drainage. Palpation/Percussion Palpation and Percussion of the abdomen reveal - Soft, Non Tender, No Rebound tenderness, No Rigidity (guarding) and No Cutaneous hyperesthesia. Note: Abdomen soft. Mild discomfort right upper quadrant at anterior surgery line. No Murphy sign. No epigastric or upper abdominal discomfort elsewhere. No renal mass. No hepatomegaly. No splenomegaly.  Not distended. No diastasis recti. No umbilical or other anterior abdominal wall hernias  Female Genitourinary Sexual Maturity Tanner 5 - Adult hair pattern. Note: No vaginal bleeding nor discharge  Peripheral Vascular Upper Extremity Inspection - Left - No Cyanotic nailbeds - Left, Not Ischemic. Inspection - Right - No Cyanotic nailbeds - Right, Not Ischemic.  Neurologic Neurologic evaluation reveals -normal attention span and ability to concentrate, able to name objects and repeat phrases. Appropriate fund of knowledge , normal sensation and normal coordination. Mental Status Affect - not angry, not paranoid. Cranial Nerves-Normal Bilaterally. Gait-Normal.  Neuropsychiatric Mental status exam performed with findings of-able to articulate well with normal speech/language, rate, volume and coherence, thought content normal with ability to perform basic computations and apply abstract reasoning and no evidence of hallucinations, delusions, obsessions or homicidal/suicidal ideation.  Musculoskeletal Global Assessment Spine, Ribs and Pelvis - no instability, subluxation or laxity. Right Upper Extremity - no instability, subluxation or laxity.  Lymphatic Head & Neck  General Head & Neck Lymphatics: Bilateral - Description - No Localized  lymphadenopathy. Axillary  General Axillary Region: Bilateral - Description - No Localized lymphadenopathy. Femoral & Inguinal  Generalized Femoral & Inguinal Lymphatics: Left - Description - No Localized lymphadenopathy. Right - Description - No Localized lymphadenopathy.    Assessment & Plan Denise Hector MD; 10/29/2020 12:38 PM)  CHRONIC CHOLECYSTITIS WITH CALCULUS (K80.10) Impression: Rather classic story of biliary colic with ultrasound noting stones in the report itself. The rest of the differential diagnosis seems unlikely. No real improvement in symptoms with compliance on antacid regimen and diet adjustment for the past few weeks.  I think she would benefit from cholecystectomy. Reasonable outpatient surgery candidate. Single site approach. She and her significant other are interested in proceeding. I think her risks of surgery are low.  The biggest risk is that it does not solve her problem there are some other etiology for her pain and symptoms. However the differential diagnosis seems underwhelming. I am skeptical and recommending gastroenterology evaluation in the absence of any other GI symptoms. She has gone to the ER 3 times with this, no improvement with medications for gastritis/antacids., so she wishes to be aggressive and proceed with surgery to break the cycle of attacks  Current Plans You are being scheduled for surgery- Our schedulers will call you.  You should hear from our office's scheduling department within 5 working days about the location, date, and time of surgery. We try to make accommodations for patient's preferences in scheduling surgery, but sometimes the OR schedule or the surgeon's schedule prevents [place] from making those accommodations.  If you have not heard from our office [phone]) in 5 working days, call the office and ask for your surgeon's nurse.  If you have other questions about your diagnosis, plan, or surgery, call the office and ask for  your surgeon's nurse.  Written instructions provided Pt Education - Pamphlet Given - Laparoscopic Gallbladder Surgery: discussed with patient and provided information. The anatomy & physiology of hepatobiliary & pancreatic function was discussed. The pathophysiology of gallbladder dysfunction was discussed. Natural history risks without surgery was discussed. I feel the risks of no intervention will lead to serious problems that outweigh the operative risks; therefore, I recommended cholecystectomy to remove the pathology. I explained laparoscopic techniques with possible need for an open approach. Probable cholangiogram to evaluate the bilary tract was explained as well.  Risks such as bleeding, infection, abscess, leak, injury to other organs, need for further treatment, heart attack, death, and other risks were discussed. I noted a good likelihood this will help address the problem. Possibility that this will not correct all abdominal symptoms was explained. Goals of post-operative recovery were discussed as well. We will work to minimize complications. An educational handout further explaining the pathology and treatment options was given as well. Questions were answered. The patient expresses understanding & wishes to proceed with surgery.  Pt Education - [facility] Laparosopic Post Op HCI (Denise Davila) Pt Education - [facility] Good Bowel Health (Denise Davila) Pt Education - Laparoscopic Cholecystectomy: gallbladder  Denise Hector, MD, FACS, MASCRS  Esophageal, Gastrointestinal & Colorectal Surgery Robotic and Minimally Invasive Surgery [facility] [address] [place], [place] [address] [phone] Fax [phone] Main/Paging  CONTACT INFORMATION: Weekday (9AM-5PM) concerns: Call [facility] main office at [phone] Weeknight (5PM-9AM) or Weekend/Holiday concerns: Check [email or web address] for General Surgery [facility] coverage (Please, do not use SecureChat as it is not  reliable communication to operating surgeons for immediate patient care)

## 2020-11-14 ENCOUNTER — Encounter (HOSPITAL_BASED_OUTPATIENT_CLINIC_OR_DEPARTMENT_OTHER): Payer: Self-pay | Admitting: Surgery

## 2020-11-14 ENCOUNTER — Other Ambulatory Visit: Payer: Self-pay

## 2020-11-14 NOTE — Progress Notes (Signed)
Spoke w/ via phone for pre-op interview--- PT Lab needs dos----  Urine preg             Lab results------ no COVID test ------ 11-15-2020 @ 1430 Arrive at ------- 0900 on 11-19-2020 NPO after MN NO Solid Food.  Clear liquids from MN until--- 0800 Med rec completed Medications to take morning of surgery ----- NONE Diabetic medication ----- n/a Patient instructed to bring photo id and insurance card day of surgery Patient aware to have Driver (ride ) / caregiver    for 24 hours after surgery -- fiance, Denise Davila Patient Special Instructions ----- n/a Pre-Op special Istructions ----- n/a Patient verbalized understanding of instructions that were given at this phone interview. Patient denies shortness of breath, chest pain, fever, cough at this phone interview.

## 2020-11-15 ENCOUNTER — Other Ambulatory Visit (HOSPITAL_COMMUNITY)
Admission: RE | Admit: 2020-11-15 | Discharge: 2020-11-15 | Disposition: A | Discharge status: Home / Self Care | Payer: 59 | Source: Ambulatory Visit | Attending: Surgery | Admitting: Surgery

## 2020-11-15 DIAGNOSIS — Z01812 Encounter for preprocedural laboratory examination: Secondary | ICD-10-CM | POA: Insufficient documentation

## 2020-11-15 DIAGNOSIS — Z20822 Contact with and (suspected) exposure to covid-19: Secondary | ICD-10-CM | POA: Diagnosis not present

## 2020-11-15 LAB — SARS CORONAVIRUS 2 (TAT 6-24 HRS): SARS Coronavirus 2: NEGATIVE

## 2020-11-19 ENCOUNTER — Ambulatory Visit (HOSPITAL_BASED_OUTPATIENT_CLINIC_OR_DEPARTMENT_OTHER): Payer: 59 | Admitting: Anesthesiology

## 2020-11-19 ENCOUNTER — Ambulatory Visit (HOSPITAL_COMMUNITY): Payer: 59

## 2020-11-19 ENCOUNTER — Ambulatory Visit (HOSPITAL_BASED_OUTPATIENT_CLINIC_OR_DEPARTMENT_OTHER)
Admission: RE | Admit: 2020-11-19 | Discharge: 2020-11-19 | Disposition: A | Discharge status: Home / Self Care | Payer: 59 | Attending: Surgery | Admitting: Surgery

## 2020-11-19 ENCOUNTER — Encounter (HOSPITAL_BASED_OUTPATIENT_CLINIC_OR_DEPARTMENT_OTHER): Payer: Self-pay | Admitting: Surgery

## 2020-11-19 ENCOUNTER — Encounter (HOSPITAL_BASED_OUTPATIENT_CLINIC_OR_DEPARTMENT_OTHER)
Admission: RE | Disposition: A | Discharge status: Home / Self Care | Payer: Self-pay | Source: Home / Self Care | Attending: Surgery

## 2020-11-19 DIAGNOSIS — Z79899 Other long term (current) drug therapy: Secondary | ICD-10-CM | POA: Diagnosis not present

## 2020-11-19 DIAGNOSIS — K8010 Calculus of gallbladder with chronic cholecystitis without obstruction: Secondary | ICD-10-CM | POA: Diagnosis present

## 2020-11-19 DIAGNOSIS — Z87891 Personal history of nicotine dependence: Secondary | ICD-10-CM | POA: Insufficient documentation

## 2020-11-19 DIAGNOSIS — Z419 Encounter for procedure for purposes other than remedying health state, unspecified: Secondary | ICD-10-CM

## 2020-11-19 HISTORY — DX: Anxiety disorder, unspecified: F41.9

## 2020-11-19 HISTORY — DX: Presence of spectacles and contact lenses: Z97.3

## 2020-11-19 HISTORY — PX: LAPAROSCOPIC CHOLECYSTECTOMY SINGLE SITE WITH INTRAOPERATIVE CHOLANGIOGRAM: SHX6538

## 2020-11-19 HISTORY — DX: Calculus of gallbladder with chronic cholecystitis without obstruction: K80.10

## 2020-11-19 HISTORY — DX: Depression, unspecified: F32.A

## 2020-11-19 LAB — POCT PREGNANCY, URINE: Preg Test, Ur: NEGATIVE

## 2020-11-19 SURGERY — LAPAROSCOPIC CHOLECYSTECTOMY SINGLE SITE WITH INTRAOPERATIVE CHOLANGIOGRAM
Anesthesia: General | Site: Abdomen

## 2020-11-19 MED ORDER — GABAPENTIN 300 MG PO CAPS
300.0000 mg | ORAL_CAPSULE | ORAL | Status: AC
  Administered 2020-11-19: 300 mg via ORAL

## 2020-11-19 MED ORDER — ROCURONIUM BROMIDE 10 MG/ML (PF) SYRINGE
PREFILLED_SYRINGE | INTRAVENOUS | Status: AC
  Filled 2020-11-19: qty 10

## 2020-11-19 MED ORDER — ONDANSETRON HCL 4 MG/2ML IJ SOLN
INTRAMUSCULAR | Status: AC
  Filled 2020-11-19: qty 2

## 2020-11-19 MED ORDER — DEXAMETHASONE SODIUM PHOSPHATE 4 MG/ML IJ SOLN
INTRAMUSCULAR | Status: DC | PRN
  Administered 2020-11-19: 5 mg via INTRAVENOUS

## 2020-11-19 MED ORDER — MIDAZOLAM HCL 2 MG/2ML IJ SOLN
INTRAMUSCULAR | Status: AC
  Filled 2020-11-19: qty 2

## 2020-11-19 MED ORDER — OXYCODONE HCL 5 MG PO TABS
ORAL_TABLET | ORAL | Status: AC
  Filled 2020-11-19: qty 1

## 2020-11-19 MED ORDER — KETOROLAC TROMETHAMINE 30 MG/ML IJ SOLN: 30.0000 mg | Freq: Once | INTRAMUSCULAR | Status: DC | PRN

## 2020-11-19 MED ORDER — PHENYLEPHRINE 40 MCG/ML (10ML) SYRINGE FOR IV PUSH (FOR BLOOD PRESSURE SUPPORT)
PREFILLED_SYRINGE | INTRAVENOUS | Status: AC
  Filled 2020-11-19: qty 10

## 2020-11-19 MED ORDER — PROMETHAZINE HCL 25 MG/ML IJ SOLN: 6.2500 mg | INTRAMUSCULAR | Status: DC | PRN

## 2020-11-19 MED ORDER — LIDOCAINE HCL (CARDIAC) PF 100 MG/5ML IV SOSY
PREFILLED_SYRINGE | INTRAVENOUS | Status: DC | PRN
  Administered 2020-11-19: 50 mg via INTRAVENOUS

## 2020-11-19 MED ORDER — PHENYLEPHRINE HCL (PRESSORS) 10 MG/ML IV SOLN
INTRAVENOUS | Status: DC | PRN
  Administered 2020-11-19 (×4): 80 ug via INTRAVENOUS

## 2020-11-19 MED ORDER — HYDROMORPHONE HCL 1 MG/ML IJ SOLN: 0.2500 mg | INTRAMUSCULAR | Status: DC | PRN

## 2020-11-19 MED ORDER — ROCURONIUM BROMIDE 100 MG/10ML IV SOLN
INTRAVENOUS | Status: DC | PRN
  Administered 2020-11-19: 60 mg via INTRAVENOUS

## 2020-11-19 MED ORDER — MEPERIDINE HCL 25 MG/ML IJ SOLN: 6.2500 mg | INTRAMUSCULAR | Status: DC | PRN

## 2020-11-19 MED ORDER — TRAMADOL HCL 50 MG PO TABS: 50.0000 mg | ORAL_TABLET | Freq: Four times a day (QID) | ORAL | 0 refills | Status: DC | PRN

## 2020-11-19 MED ORDER — PROPOFOL 10 MG/ML IV BOLUS
INTRAVENOUS | Status: DC | PRN
  Administered 2020-11-19: 200 mg via INTRAVENOUS

## 2020-11-19 MED ORDER — MIDAZOLAM HCL 5 MG/5ML IJ SOLN
INTRAMUSCULAR | Status: DC | PRN
  Administered 2020-11-19: 2 mg via INTRAVENOUS

## 2020-11-19 MED ORDER — IOHEXOL 300 MG/ML  SOLN
INTRAMUSCULAR | Status: DC | PRN
  Administered 2020-11-19: 3.5 mL

## 2020-11-19 MED ORDER — SUGAMMADEX SODIUM 200 MG/2ML IV SOLN
INTRAVENOUS | Status: DC | PRN
  Administered 2020-11-19: 200 mg via INTRAVENOUS

## 2020-11-19 MED ORDER — ONDANSETRON HCL 4 MG/2ML IJ SOLN
INTRAMUSCULAR | Status: DC | PRN
  Administered 2020-11-19: 4 mg via INTRAVENOUS

## 2020-11-19 MED ORDER — CELECOXIB 200 MG PO CAPS
ORAL_CAPSULE | ORAL | Status: AC
  Filled 2020-11-19: qty 1

## 2020-11-19 MED ORDER — ONDANSETRON HCL 4 MG PO TABS: 4.0000 mg | ORAL_TABLET | Freq: Three times a day (TID) | ORAL | 5 refills | Status: DC | PRN

## 2020-11-19 MED ORDER — PROPOFOL 10 MG/ML IV BOLUS
INTRAVENOUS | Status: AC
  Filled 2020-11-19: qty 40

## 2020-11-19 MED ORDER — LIDOCAINE 2% (20 MG/ML) 5 ML SYRINGE
INTRAMUSCULAR | Status: AC
  Filled 2020-11-19: qty 5

## 2020-11-19 MED ORDER — FENTANYL CITRATE (PF) 100 MCG/2ML IJ SOLN
INTRAMUSCULAR | Status: AC
  Filled 2020-11-19: qty 2

## 2020-11-19 MED ORDER — BUPIVACAINE-EPINEPHRINE 0.25% -1:200000 IJ SOLN
INTRAMUSCULAR | Status: DC | PRN
  Administered 2020-11-19: 30 mL

## 2020-11-19 MED ORDER — SCOPOLAMINE 1 MG/3DAYS TD PT72: 1.0000 | MEDICATED_PATCH | TRANSDERMAL | Status: DC

## 2020-11-19 MED ORDER — CHLORHEXIDINE GLUCONATE CLOTH 2 % EX PADS: 6.0000 | MEDICATED_PAD | Freq: Once | CUTANEOUS | Status: DC

## 2020-11-19 MED ORDER — OXYCODONE HCL 5 MG/5ML PO SOLN: 5.0000 mg | Freq: Once | ORAL | Status: AC | PRN

## 2020-11-19 MED ORDER — DEXAMETHASONE SODIUM PHOSPHATE 10 MG/ML IJ SOLN
INTRAMUSCULAR | Status: AC
  Filled 2020-11-19: qty 1

## 2020-11-19 MED ORDER — ACETAMINOPHEN 500 MG PO TABS
1000.0000 mg | ORAL_TABLET | Freq: Once | ORAL | Status: AC
  Administered 2020-11-19: 1000 mg via ORAL

## 2020-11-19 MED ORDER — BUPIVACAINE LIPOSOME 1.3 % IJ SUSP
INTRAMUSCULAR | Status: DC | PRN
  Administered 2020-11-19: 20 mL

## 2020-11-19 MED ORDER — LACTATED RINGERS IV SOLN: INTRAVENOUS | Status: DC

## 2020-11-19 MED ORDER — GABAPENTIN 300 MG PO CAPS
ORAL_CAPSULE | ORAL | Status: AC
  Filled 2020-11-19: qty 1

## 2020-11-19 MED ORDER — CELECOXIB 200 MG PO CAPS
200.0000 mg | ORAL_CAPSULE | ORAL | Status: AC
  Administered 2020-11-19: 200 mg via ORAL

## 2020-11-19 MED ORDER — ACETAMINOPHEN 500 MG PO TABS
ORAL_TABLET | ORAL | Status: AC
  Filled 2020-11-19: qty 2

## 2020-11-19 MED ORDER — ACETAMINOPHEN 500 MG PO TABS: 1000.0000 mg | ORAL_TABLET | ORAL | Status: AC

## 2020-11-19 MED ORDER — FENTANYL CITRATE (PF) 100 MCG/2ML IJ SOLN
INTRAMUSCULAR | Status: DC | PRN
  Administered 2020-11-19: 25 ug via INTRAVENOUS
  Administered 2020-11-19: 100 ug via INTRAVENOUS
  Administered 2020-11-19: 50 ug via INTRAVENOUS
  Administered 2020-11-19: 25 ug via INTRAVENOUS

## 2020-11-19 MED ORDER — OXYCODONE HCL 5 MG PO TABS
5.0000 mg | ORAL_TABLET | Freq: Once | ORAL | Status: AC | PRN
  Administered 2020-11-19: 5 mg via ORAL

## 2020-11-19 MED ORDER — BUPIVACAINE LIPOSOME 1.3 % IJ SUSP: 20.0000 mL | Freq: Once | INTRAMUSCULAR | Status: AC

## 2020-11-19 MED ORDER — ENSURE PRE-SURGERY PO LIQD: 296.0000 mL | Freq: Once | ORAL | Status: DC

## 2020-11-19 SURGICAL SUPPLY — 49 items
APL PRP STRL LF DISP 70% ISPRP (MISCELLANEOUS) ×2
APPLIER CLIP 5 13 M/L LIGAMAX5 (MISCELLANEOUS) ×3
APR CLP MED LRG 5 ANG JAW (MISCELLANEOUS) ×2
BAG SPEC RTRVL 10 TROC 200 (ENDOMECHANICALS) ×2
CABLE HIGH FREQUENCY MONO STRZ (ELECTRODE) ×3 IMPLANT
CHLORAPREP W/TINT 26 (MISCELLANEOUS) ×3 IMPLANT
CLIP APPLIE 5 13 M/L LIGAMAX5 (MISCELLANEOUS) ×2 IMPLANT
CNTNR URN SCR LID CUP LEK RST (MISCELLANEOUS) ×2 IMPLANT
CONT SPEC 4OZ STRL OR WHT (MISCELLANEOUS) ×3
COVER MAYO STAND STRL (DRAPES) ×3 IMPLANT
COVER WAND RF STERILE (DRAPES) ×3 IMPLANT
DECANTER SPIKE VIAL GLASS SM (MISCELLANEOUS) ×3 IMPLANT
DRAIN CHANNEL 19F RND (DRAIN) IMPLANT
DRAPE C-ARM 42X120 X-RAY (DRAPES) ×3 IMPLANT
DRAPE C-ARMOR (DRAPES) IMPLANT
DRAPE WARM FLUID 44X44 (DRAPES) ×3 IMPLANT
DRSG TEGADERM 4X4.75 (GAUZE/BANDAGES/DRESSINGS) ×6 IMPLANT
ELECT REM PT RETURN 9FT ADLT (ELECTROSURGICAL) ×3
ELECTRODE REM PT RTRN 9FT ADLT (ELECTROSURGICAL) ×2 IMPLANT
ENDOLOOP SUT PDS II  0 18 (SUTURE)
ENDOLOOP SUT PDS II 0 18 (SUTURE) IMPLANT
EVACUATOR SILICONE 100CC (DRAIN) IMPLANT
GLOVE SURG LTX SZ8 (GLOVE) ×3 IMPLANT
GLOVE SURG UNDER LTX SZ8 (GLOVE) ×3 IMPLANT
GOWN STRL REUS W/TWL XL LVL3 (GOWN DISPOSABLE) ×3 IMPLANT
IRRIG SUCT STRYKERFLOW 2 WTIP (MISCELLANEOUS)
IRRIGATION SUCT STRKRFLW 2 WTP (MISCELLANEOUS) IMPLANT
KIT TURNOVER CYSTO (KITS) ×3 IMPLANT
NEEDLE BIOPSY 14X6 SOFT TISS (NEEDLE) IMPLANT
NEEDLE INSUFFLATION 120MM (ENDOMECHANICALS) IMPLANT
PACK BASIN DAY SURGERY FS (CUSTOM PROCEDURE TRAY) ×3 IMPLANT
PAD POSITIONING PINK XL (MISCELLANEOUS) ×3 IMPLANT
POUCH RETRIEVAL ECOSAC 10 (ENDOMECHANICALS) ×2 IMPLANT
POUCH RETRIEVAL ECOSAC 10MM (ENDOMECHANICALS) ×2
SCISSORS LAP 5X35 DISP (ENDOMECHANICALS) ×3 IMPLANT
SET CHOLANGIOGRAPH MIX (MISCELLANEOUS) ×3 IMPLANT
SET TUBE SMOKE EVAC HIGH FLOW (TUBING) ×3 IMPLANT
SHEARS HARMONIC ACE PLUS 36CM (ENDOMECHANICALS) ×3 IMPLANT
SPONGE GAUZE 2X2 8PLY STRL LF (GAUZE/BANDAGES/DRESSINGS) ×3 IMPLANT
SUT MNCRL AB 4-0 PS2 18 (SUTURE) ×6 IMPLANT
SUT PDS AB 1 CT1 27 (SUTURE) ×6 IMPLANT
SUT VIC AB 2-0 UR6 27 (SUTURE) IMPLANT
SYR 20ML LL LF (SYRINGE) ×3 IMPLANT
TOWEL OR 17X26 10 PK STRL BLUE (TOWEL DISPOSABLE) ×3 IMPLANT
TRAY LAPAROSCOPIC (CUSTOM PROCEDURE TRAY) ×3 IMPLANT
TROCAR 5M 150ML BLDLS (TROCAR) ×3 IMPLANT
TROCAR BLADELESS OPT 5 100 (ENDOMECHANICALS) IMPLANT
TROCAR XCEL NON-BLD 11X100MML (ENDOMECHANICALS) IMPLANT
TROCAR Z-THREAD FIOS 5X100MM (TROCAR) ×3 IMPLANT

## 2020-11-19 NOTE — Anesthesia Procedure Notes (Signed)
Procedure Name: Intubation Date/Time: 11/19/2020 11:43 AM Performed by: Justice Rocher, CRNA Pre-anesthesia Checklist: Patient identified, Emergency Drugs available, Suction available, Patient being monitored and Timeout performed Patient Re-evaluated:Patient Re-evaluated prior to induction Oxygen Delivery Method: Circle system utilized Preoxygenation: Pre-oxygenation with 100% oxygen Induction Type: IV induction Ventilation: Mask ventilation without difficulty Laryngoscope Size: Mac and 3 Grade View: Grade I Tube type: Oral Tube size: 7.0 mm Number of attempts: 1 Airway Equipment and Method: Stylet and Oral airway Placement Confirmation: ETT inserted through vocal cords under direct vision,  positive ETCO2,  breath sounds checked- equal and bilateral and CO2 detector Secured at: 22 cm Tube secured with: Tape Dental Injury: Teeth and Oropharynx as per pre-operative assessment

## 2020-11-19 NOTE — Interval H&P Note (Signed)
History and Physical Interval Note:  11/19/2020 11:07 AM  Denise Davila  has presented today for surgery, with the diagnosis of SYMPTOMATIC BILIARY COLIC, PROBABLE CHRONIC CHOLECYSTITIS.  The various methods of treatment have been discussed with the patient and family. After consideration of risks, benefits and other options for treatment, the patient has consented to  Procedure(s): [place] CHOLANGIOGRAM (N/A) LIVER BIOPSY (N/A) as a surgical intervention.  The patient's history has been reviewed, patient examined, no change in status, stable for surgery.  I have reviewed the patient's chart and labs.  Questions were answered to the patient's satisfaction.    I have re-reviewed the the patient's records, history, medications, and allergies.  I have re-examined the patient.  I again discussed intraoperative plans and goals of post-operative recovery.  The patient agrees to proceed.  Denise Davila  [birth year] [id number]  Patient Care Team: Sharion Balloon, FNP as PCP - General (Family Medicine) Michael Boston, MD as Consulting Physician (General Surgery) Gwenlyn Perking, FNP (Family Medicine)  Patient Active Problem List   Diagnosis Date Noted   Chronic calculous cholecystitis 10/29/2020   GAD (generalized anxiety disorder) 08/22/2015   Depression 08/22/2015    Past Medical History:  Diagnosis Date   Anxiety    Chronic cholecystitis with calculus    Depression    Wears glasses     Past Surgical History:  Procedure Laterality Date   NO PAST SURGERIES      Social History   Socioeconomic History   Marital status: Single    Spouse name: Not on file   Number of children: 0   Years of education: Not on file   Highest education level: Not on file  Occupational History   Occupation: stamping    Comment: [employer]  Tobacco Use   Smoking status: Former Smoker    Years: 1.00    Types: Cigarettes    Quit  date: 11/15/2019    Years since quitting: 1.0   Smokeless tobacco: Never Used  Vaping Use   Vaping Use: Every day   Devices: per unsure of name  Substance and Sexual Activity   Alcohol use: Yes    Alcohol/week: 24.0 standard drinks    Types: 24 Cans of beer per week    Comment: 11-14-2020  5th of liquor and two case's of beer per week   Drug use: Never   Sexual activity: Not on file  Other Topics Concern   Not on file  Social History Narrative   Not on file   Social Determinants of Health   Financial Resource Strain: Not on file  Food Insecurity: Not on file  Transportation Needs: Not on file  Physical Activity: Not on file  Stress: Not on file  Social Connections: Not on file  Intimate Partner Violence: Not on file    Family History  Problem Relation Age of Onset   Hyperlipidemia Father     No medications prior to admission.    Current Facility-Administered Medications  Medication Dose Route Frequency Provider Last Rate Last Admin   acetaminophen (TYLENOL) tablet 1,000 mg  1,000 mg Oral On Call to OR Michael Boston, MD       bupivacaine liposome (EXPAREL) 1.3 % injection 266 mg  20 mL Infiltration Once Michael Boston, MD       Chlorhexidine Gluconate Cloth 2 % PADS 6 each  6 each Topical Once Michael Boston, MD       And  Chlorhexidine Gluconate Cloth 2 % PADS 6 each  6 each Topical Once Michael Boston, MD       Derrill Memo ON 11/20/2020] feeding supplement (ENSURE PRE-SURGERY) liquid 296 mL  296 mL Oral Once Michael Boston, MD       lactated ringers infusion   Intravenous Continuous Nolon Nations, MD 50 mL/hr at 11/19/20 1001 New Bag at 11/19/20 1001   scopolamine (TRANSDERM-SCOP) 1 MG/3DAYS 1.5 mg  1 patch Transdermal Q72H Nunzio Cobbs M, DO         No Known Allergies  BP 112/70   Pulse (!) 59   Temp (!) 97.1 F (36.2 C) (Oral)   Resp 15   Ht 5\' 3"  (1.6 m)   Wt 56.6 kg   LMP 11/12/2020 (Exact Date)   BMI 22.11 kg/m   Labs: Results for orders placed or  performed during the hospital encounter of 11/19/20 (from the past 48 hour(s))  Pregnancy, urine POC     Status: None   Collection Time: 11/19/20  9:14 AM  Result Value Ref Range   Preg Test, Ur NEGATIVE NEGATIVE    Comment:        THE SENSITIVITY OF THIS METHODOLOGY IS >24 mIU/mL     Imaging / Studies: No results found.   [name], M.D., F.A.C.S. Gastrointestinal and Minimally Invasive Surgery [facility] [address] [place], [place] [address] [phone] Main / Paging  11/19/2020 11:07 AM    [name]

## 2020-11-19 NOTE — Anesthesia Postprocedure Evaluation (Signed)
Anesthesia Post Note  Patient: Advertising copywriter  Procedure(s) Performed: SINGLE SITE LAPAROSCOPIC CHOLECYSTECTOMY SINGLE SITE WITH INTRAOPERATIVE CHOLANGIOGRAM (N/A Abdomen)     Patient location during evaluation: PACU Anesthesia Type: General Level of consciousness: awake and alert, oriented and patient cooperative Pain management: pain level controlled Vital Signs Assessment: post-procedure vital signs reviewed and stable Respiratory status: spontaneous breathing, nonlabored ventilation and respiratory function stable Cardiovascular status: blood pressure returned to baseline and stable Postop Assessment: no apparent nausea or vomiting Anesthetic complications: no   No complications documented.  Last Vitals:  Vitals:   11/19/20 1249 11/19/20 1300  BP: 117/68 115/83  Pulse: 94 80  Resp: 15 12  Temp: 36.4 C   SpO2: 100% 100%    Last Pain:  Vitals:   11/19/20 0933  TempSrc: Oral                 Pervis Hocking

## 2020-11-19 NOTE — Discharge Instructions (Signed)
Information for Discharge Teaching: EXPAREL (bupivacaine liposome injectable suspension)   Your surgeon or anesthesiologist gave you EXPAREL(bupivacaine) to help control your pain after surgery.   EXPAREL is a local anesthetic that provides pain relief by numbing the tissue around the surgical site.  EXPAREL is designed to release pain medication over time and can control pain for up to 72 hours.  Depending on how you respond to EXPAREL, you may require less pain medication during your recovery.  Possible side effects:  Temporary loss of sensation or ability to move in the area where bupivacaine was injected.  Nausea, vomiting, constipation  Rarely, numbness and tingling in your mouth or lips, lightheadedness, or anxiety may occur.  Call your doctor right away if you think you may be experiencing any of these sensations, or if you have other questions regarding possible side effects.  Follow all other discharge instructions given to you by your surgeon or nurse. Eat a healthy diet and drink plenty of water or other fluids.  If you return to the hospital for any reason within 96 hours following the administration of EXPAREL, it is important for health care providers to know that you have received this anesthetic. A teal colored band has been placed on your arm with the date, time and amount of EXPAREL you have received in order to alert and inform your health care providers. Please leave this armband in place for the full 96 hours following administration, and then you may remove the band.  Post Anesthesia Home Care Instructions  Activity: Get plenty of rest for the remainder of the day. A responsible individual must stay with you for 24 hours following the procedure.  For the next 24 hours, DO NOT: -Drive a car -Paediatric nurse -Drink alcoholic beverages -Take any medication unless instructed by your physician -Make any legal decisions or sign important papers.  Meals: Start  with liquid foods such as gelatin or soup. Progress to regular foods as tolerated. Avoid greasy, spicy, heavy foods. If nausea and/or vomiting occur, drink only clear liquids until the nausea and/or vomiting subsides. Call your physician if vomiting continues.  Special Instructions/Symptoms: Your throat may feel dry or sore from the anesthesia or the breathing tube placed in your throat during surgery. If this causes discomfort, gargle with warm salt water. The discomfort should disappear within 24 hours.    LAPAROSCOPIC SURGERY: POST OP INSTRUCTIONS  ######################################################################  EAT Gradually transition to a high fiber diet with a fiber supplement over the next few weeks after discharge.  Start with a pureed / full liquid diet (see below)  WALK Walk an hour a day.  Control your pain to do that.    CONTROL PAIN Control pain so that you can walk, sleep, tolerate sneezing/coughing, go up/down stairs.  HAVE A BOWEL MOVEMENT DAILY Keep your bowels regular to avoid problems.  OK to try a laxative to override constipation.  OK to use an antidairrheal to slow down diarrhea.  Call if not better after 2 tries  CALL IF YOU HAVE PROBLEMS/CONCERNS Call if you are still struggling despite following these instructions. Call if you have concerns not answered by these instructions  ######################################################################    1. DIET: Follow a light bland diet & liquids the first 24 hours after arrival home, such as soup, liquids, starches, etc.  Be sure to drink plenty of fluids.  Quickly advance to a usual solid diet within a few days.  Avoid fast food or heavy meals as your are more likely  to get nauseated or have irregular bowels.  A low-fat, high-fiber diet for the rest of your life is ideal.  2. Take your usually prescribed home medications unless otherwise directed.  3. PAIN CONTROL: a. Pain is best controlled by a usual  combination of three different methods TOGETHER: i. Ice/Heat ii. Over the counter pain medication iii. Prescription pain medication b. Most patients will experience some swelling and bruising around the incisions.  Ice packs or heating pads (30-60 minutes up to 6 times a day) will help. Use ice for the first few days to help decrease swelling and bruising, then switch to heat to help relax tight/sore spots and speed recovery.  Some people prefer to use ice alone, heat alone, alternating between ice & heat.  Experiment to what works for you.  Swelling and bruising can take several weeks to resolve.   c. It is helpful to take an over-the-counter pain medication regularly for the first few weeks.  Choose one of the following that works best for you: i. Naproxen (Aleve, etc)  Two 220mg  tabs twice a day ii. Ibuprofen (Advil, etc) Three 200mg  tabs four times a day (every meal & bedtime) iii. Acetaminophen (Tylenol, etc) 500-650mg  four times a day (every meal & bedtime) d. A  prescription for pain medication (such as oxycodone, hydrocodone, tramadol, gabapentin, methocarbamol, etc) should be given to you upon discharge.  Take your pain medication as prescribed.  i. If you are having problems/concerns with the prescription medicine (does not control pain, nausea, vomiting, rash, itching, etc), please call us [phone] to see if we need to switch you to a different pain medicine that will work better for you and/or control your side effect better. ii. If you need a refill on your pain medication, please give [place] 48 hour notice.  contact your pharmacy.  They will contact our office to request authorization. Prescriptions will not be filled after 5 pm or on week-ends  4. Avoid getting constipated.   a. Between the surgery and the pain medications, it is common to experience some constipation.   b. Increasing fluid intake and taking a fiber supplement (such as Metamucil, Citrucel, FiberCon, MiraLax, etc) 1-2  times a day regularly will usually help prevent this problem from occurring.   c. A mild laxative (prune juice, Milk of Magnesia, MiraLax, etc) should be taken according to package directions if there are no bowel movements after 48 hours.   5. Watch out for diarrhea.   a. If you have many loose bowel movements, simplify your diet to bland foods & liquids for a few days.   b. Stop any stool softeners and decrease your fiber supplement.   c. Switching to mild anti-diarrheal medications (Kayopectate, Pepto Bismol) can help.   d. If this worsens or does not improve, please call us.  6. Wash / shower every day.  You may shower over the dressings as they are waterproof.  Continue to shower over incision(s) after the dressing is off.  7. REMOVE ALL DRESSINGS: Remove your waterproof bandages (tegaderm clear band-aids, steristrip skin tapes, etc) THREE DAYS AFTER SURGERY.  You may leave the incisions open to air.  You may replace a dressing/Band-Aid to cover the incision for comfort if you wish.   8. ACTIVITIES as tolerated:   i. You may resume regular (light) daily activities beginning the next day--such as daily self-care, walking, climbing stairs--gradually increasing activities as tolerated.  If you can walk 30 minutes without difficulty, it is safe to  try more intense activity such as jogging, treadmill, bicycling, low-impact aerobics, swimming, etc. ii. Save the most intensive and strenuous activity for last such as sit-ups, heavy lifting, contact sports, etc  Refrain from any heavy lifting or straining until you are off narcotics for pain control.   iii. DO NOT PUSH THROUGH PAIN.  Let pain be your guide: If it hurts to do something, don't do it.  Pain is your body warning you to avoid that activity for another week until the pain goes down. iv. You may drive when you are no longer taking prescription pain medication, you can comfortably wear a seatbelt, and you can safely maneuver your car and apply  brakes. v. You may have sexual intercourse when it is comfortable.  9. FOLLOW UP in our office a. Please call [facility] at [phone] to set up an appointment to see your surgeon in the office for a follow-up appointment approximately 2-3 weeks after your surgery. b. Make sure that you call for this appointment the day you arrive home to insure a convenient appointment time.  10. IF YOU HAVE DISABILITY OR FAMILY LEAVE FORMS, BRING THEM TO THE OFFICE FOR PROCESSING.  DO NOT GIVE THEM TO YOUR DOCTOR.   WHEN TO CALL us [phone]: 1. Poor pain control 2. Reactions / problems with new medications (rash/itching, nausea, etc)  3. Fever over 101.5 F (38.5 C) 4. Inability to urinate 5. Nausea and/or vomiting 6. Worsening swelling or bruising 7. Continued bleeding from incision. 8. Increased pain, redness, or drainage from the incision   The clinic staff is available to answer your questions during regular business hours (8:30am-5pm).  Please don't hesitate to call and ask to speak to one of our nurses for clinical concerns.   If you have a medical emergency, go to the nearest emergency room or call 911.  A surgeon from [facility] is always on call at the [facility], [place], [place], [place], [place]  [address] ? MAIN: [phone] ? TOLL FREE: [phone] ?  FAX [phone] [email or web address]

## 2020-11-19 NOTE — Anesthesia Preprocedure Evaluation (Addendum)
Anesthesia Evaluation  Patient identified by MRN, date of birth, ID band Patient awake    Reviewed: Allergy & Precautions, NPO status , Patient's Chart, lab work & pertinent test results  Airway Mallampati: III  TM Distance: >3 FB Neck ROM: Full  Mouth opening: Limited Mouth Opening  Dental  (+) Teeth Intact, Dental Advisory Given   Pulmonary Patient abstained from smoking., former smoker,  Currently vapes   Pulmonary exam normal breath sounds clear to auscultation       Cardiovascular negative cardio ROS Normal cardiovascular exam Rhythm:Regular Rate:Normal     Neuro/Psych PSYCHIATRIC DISORDERS Anxiety Depression negative neurological ROS     GI/Hepatic (+)     substance abuse (etOH abuse 24 drinks/wk)  alcohol use, Symptomatic biliary colic, chronic cholecystitis    Endo/Other  negative endocrine ROS  Renal/GU negative Renal ROS  negative genitourinary   Musculoskeletal negative musculoskeletal ROS (+)   Abdominal   Peds negative pediatric ROS (+)  Hematology negative hematology ROS (+)   Anesthesia Other Findings   Reproductive/Obstetrics negative OB ROS                            Anesthesia Physical Anesthesia Plan  ASA: I  Anesthesia Plan: General   Post-op Pain Management:    Induction: Intravenous  PONV Risk Score and Plan: 4 or greater and Ondansetron, Dexamethasone, Midazolam, Scopolamine patch - Pre-op and Treatment may vary due to age or medical condition  Airway Management Planned: Oral ETT  Additional Equipment: None  Intra-op Plan:   Post-operative Plan: Extubation in OR  Informed Consent: I have reviewed the patients History and Physical, chart, labs and discussed the procedure including the risks, benefits and alternatives for the proposed anesthesia with the patient or authorized representative who has indicated his/her understanding and acceptance.      Dental advisory given  Plan Discussed with: CRNA  Anesthesia Plan Comments:         Anesthesia Quick Evaluation

## 2020-11-19 NOTE — Op Note (Addendum)
11/19/2020  PATIENT:  Denise Davila  25 y.o. female  Patient Care Team: Sharion Balloon, FNP as PCP - General (Family Medicine) Michael Boston, MD as Consulting Physician (General Surgery) Gwenlyn Perking, FNP (Family Medicine)  PRE-OPERATIVE DIAGNOSIS:    Chronic Calculus cholecystitis  POST-OPERATIVE DIAGNOSIS:   Chronic Calculus cholecystitis  PROCEDURE:  SINGLE SITE Laparoscopic cholecystectomy with intraoperative cholangiogram (CPT code [phone])  SURGEON:  [name], MD, FACS.  ASSISTANT: RNFA   ANESTHESIA:    General with endotracheal intubation Local anesthetic as a field block  EBL:  (See Anesthesia Intraoperative Record) No intake/output data recorded.  Delay start of Pharmacological VTE agent (>24hrs) due to surgical blood loss or risk of bleeding:  no  DRAINS: None   SPECIMEN: Gallbladder    DISPOSITION OF SPECIMEN:  PATHOLOGY  COUNTS:  YES  PLAN OF CARE: Discharge to home after PACU  PATIENT DISPOSITION:  PACU - hemodynamically stable.  INDICATION: Young woman with intermittent upper abdominal pain in her upper quadrant and gallstones strongly suspicious for biliary colic.  Numerous ER visits.  I recommended diagnostic laparoscopy with probable cholecystectomy.  The anatomy & physiology of hepatobiliary & pancreatic function was discussed.  The pathophysiology of gallbladder dysfunction was discussed.  Natural history risks without surgery was discussed.   I feel the risks of no intervention will lead to serious problems that outweigh the operative risks; therefore, I recommended cholecystectomy to remove the pathology.  I explained laparoscopic techniques with possible need for an open approach.  Probable cholangiogram to evaluate the bilary tract was explained as well.    Risks such as bleeding, infection, abscess, leak, injury to other organs, need for further treatment, heart attack, death, and other risks were discussed.  I noted a good  likelihood this will help address the problem.  Possibility that this will not correct all abdominal symptoms was explained.  Goals of post-operative recovery were discussed as well.  We will work to minimize complications.  An educational handout further explaining the pathology and treatment options was given as well.  Questions were answered.  The patient expresses understanding & wishes to proceed with surgery.  OR FINDINGS: Dilated boggy gallbladder with chronic wall thickening consistent with chronic cholecystitis.  Cholangiogram showing narrowed biliary system but had rather typical anatomy.  No common bile duct stone stricture or leak.  Liver: normal  DESCRIPTION:   The patient was identified & brought in the operating room. The patient was positioned supine with arms tucked. SCDs were active during the entire case. The patient underwent general anesthesia without any difficulty.  The abdomen was prepped and draped in a sterile fashion. A Surgical Timeout confirmed our plan.  I made a transverse curvilinear incision through the superior umbilical fold.  I placed a 81mm long port through the supraumbilical fascia using a modified Hassan cutdown technique with umbilical stalk fascial countertraction. I began carbon dioxide insufflation.  No change in end tidal CO2 measurement.   Camera inspection revealed no injury. There were no adhesions to the anterior abdominal wall supraumbilically.  I proceeded to continue with single site technique. I placed a #5 port in left upper aspect of the wound. I placed a 5 mm atraumatic grasper in the right inferior aspect of the wound.  I turned attention to the right upper quadrant.  The gallbladder fundus was elevated cephalad. I freed adhesions to the ventral surface of the gallbladder off carefully.  I freed the peritoneal coverings between the gallbladder and the  liver on the posteriolateral and anteriomedial walls. I alternated between Harmonic & blunt  [place] dissection to help get a good critical view of the cystic artery and cystic duct.  did further dissection to free 80%of the gallbladder off the liver bed to get a good critical view of the infundibulum and cystic duct. I dissected out the cystic artery; and, after getting a good 360 view, ligated the anterior & posterior branches of the cystic artery close on the infundibulum using the Harmonic ultrasonic dissection.  I skeletonized the cystic duct.  I placed a clip on the infundibulum. I did a partial cystic duct-otomy and ensured patency. I placed a 5 [place] cholangiocatheter through a puncture site at the right subcostal ridge of the abdominal wall and directed it into the cystic duct.  We ran a cholangiogram with dilute radio-opaque contrast and continuous fluoroscopy. Contrast flowed from a side branch consistent with cystic duct cannulization. Contrast flowed up the common hepatic duct into the right and left intrahepatic chains out to secondary radicals. Contrast flowed down the common bile duct easily across the normal ampulla into the duodenum.  This was consistent with a normal cholangiogram.  I removed the cholangiocatheter. I placed clips on the cystic duct x4.  I completed cystic duct transection. I freed the gallbladder from its remaining attachments to the liver. I took a small wedge of liver en bloc given it being rather intracorporeal.  I ensured hemostasis on the gallbladder fossa of the liver and elsewhere. I inspected the rest of the abdomen & detected no injury nor bleeding elsewhere.  I removed the gallbladder out the supraumbilical fascia. I closed the fascia transversely using #1 PDS interrupted stitches. I closed the skin using 4-0 monocryl stitch.  Sterile dressing was applied. The patient was extubated & arrived in the PACU in stable condition..  I had discussed postoperative care with the patient in the holding area. I discussed operative findings, updated the patient's  status, discussed probable steps to recovery, and gave postoperative recommendations to the patient's significant other, Halford Decamp.  Recommendations were made.  Questions were answered.  He expressed understanding & appreciation.  [name], M.D., F.A.C.S. Gastrointestinal and Minimally Invasive Surgery [facility] [address] [place], [place] [address] [phone] Main / Paging  11/19/2020 12:37 PM

## 2020-11-19 NOTE — Transfer of Care (Signed)
Immediate Anesthesia Transfer of Care Note  Patient: Denise Davila  Procedure(s) Performed: Procedure(s) (LRB): SINGLE SITE LAPAROSCOPIC CHOLECYSTECTOMY SINGLE SITE WITH INTRAOPERATIVE CHOLANGIOGRAM (N/A)  Patient Location: PACU  Anesthesia Type: General  Level of Consciousness: awake, sedated, patient cooperative and responds to stimulation  Airway & Oxygen Therapy: Patient Spontanous Breathing and Patient connected to [place] 02 and soft FM   Post-op Assessment: Report given to PACU RN, Post -op Vital signs reviewed and stable and Patient moving all extremities  Post vital signs: Reviewed and stable  Complications: No apparent anesthesia complications

## 2020-11-20 LAB — SURGICAL PATHOLOGY

## 2020-11-22 ENCOUNTER — Encounter (HOSPITAL_BASED_OUTPATIENT_CLINIC_OR_DEPARTMENT_OTHER): Payer: Self-pay | Admitting: Surgery

## 2020-11-25 ENCOUNTER — Encounter: Payer: Self-pay | Admitting: Family Medicine

## 2020-12-04 ENCOUNTER — Other Ambulatory Visit: Payer: Self-pay

## 2020-12-04 ENCOUNTER — Emergency Department (HOSPITAL_BASED_OUTPATIENT_CLINIC_OR_DEPARTMENT_OTHER)
Admission: EM | Admit: 2020-12-04 | Discharge: 2020-12-04 | Disposition: A | Discharge status: Home / Self Care | Payer: 59 | Attending: Emergency Medicine | Admitting: Emergency Medicine

## 2020-12-04 ENCOUNTER — Encounter (HOSPITAL_BASED_OUTPATIENT_CLINIC_OR_DEPARTMENT_OTHER): Payer: Self-pay

## 2020-12-04 ENCOUNTER — Emergency Department (HOSPITAL_BASED_OUTPATIENT_CLINIC_OR_DEPARTMENT_OTHER): Payer: 59

## 2020-12-04 DIAGNOSIS — K2920 Alcoholic gastritis without bleeding: Secondary | ICD-10-CM | POA: Diagnosis not present

## 2020-12-04 DIAGNOSIS — D259 Leiomyoma of uterus, unspecified: Secondary | ICD-10-CM

## 2020-12-04 DIAGNOSIS — Z87891 Personal history of nicotine dependence: Secondary | ICD-10-CM | POA: Diagnosis not present

## 2020-12-04 DIAGNOSIS — R1011 Right upper quadrant pain: Secondary | ICD-10-CM | POA: Diagnosis present

## 2020-12-04 LAB — CBC WITH DIFFERENTIAL/PLATELET
Abs Immature Granulocytes: 0.03 10*3/uL (ref 0.00–0.07)
Basophils Absolute: 0.1 10*3/uL (ref 0.0–0.1)
Basophils Relative: 1 %
Eosinophils Absolute: 0.2 10*3/uL (ref 0.0–0.5)
Eosinophils Relative: 2 %
HCT: 37.3 % (ref 36.0–46.0)
Hemoglobin: 12.5 g/dL (ref 12.0–15.0)
Immature Granulocytes: 0 %
Lymphocytes Relative: 29 %
Lymphs Abs: 2.4 10*3/uL (ref 0.7–4.0)
MCH: 31.1 pg (ref 26.0–34.0)
MCHC: 33.5 g/dL (ref 30.0–36.0)
MCV: 92.8 fL (ref 80.0–100.0)
Monocytes Absolute: 0.6 10*3/uL (ref 0.1–1.0)
Monocytes Relative: 7 %
Neutro Abs: 5 10*3/uL (ref 1.7–7.7)
Neutrophils Relative %: 61 %
Platelets: 293 10*3/uL (ref 150–400)
RBC: 4.02 MIL/uL (ref 3.87–5.11)
RDW: 11.9 % (ref 11.5–15.5)
WBC: 8.2 10*3/uL (ref 4.0–10.5)
nRBC: 0 % (ref 0.0–0.2)

## 2020-12-04 LAB — COMPREHENSIVE METABOLIC PANEL
ALT: 18 U/L (ref 0–44)
AST: 24 U/L (ref 15–41)
Albumin: 4.6 g/dL (ref 3.5–5.0)
Alkaline Phosphatase: 43 U/L (ref 38–126)
Anion gap: 9 (ref 5–15)
BUN: 6 mg/dL (ref 6–20)
CO2: 26 mmol/L (ref 22–32)
Calcium: 8.8 mg/dL — ABNORMAL LOW (ref 8.9–10.3)
Chloride: 103 mmol/L (ref 98–111)
Creatinine, Ser: 0.56 mg/dL (ref 0.44–1.00)
GFR, Estimated: >60 mL/min (ref >60)
Glucose, Bld: 103 mg/dL — ABNORMAL HIGH (ref 70–99)
Potassium: 3.4 mmol/L — ABNORMAL LOW (ref 3.5–5.1)
Sodium: 138 mmol/L (ref 135–145)
Total Bilirubin: 0.4 mg/dL (ref 0.3–1.2)
Total Protein: 7.6 g/dL (ref 6.5–8.1)

## 2020-12-04 LAB — PREGNANCY, URINE: Preg Test, Ur: NEGATIVE

## 2020-12-04 MED ORDER — OMEPRAZOLE 20 MG PO CPDR: 20.0000 mg | DELAYED_RELEASE_CAPSULE | Freq: Every day | ORAL | 0 refills | Status: AC

## 2020-12-04 MED ORDER — ONDANSETRON HCL 4 MG/2ML IJ SOLN
4.0000 mg | Freq: Once | INTRAMUSCULAR | Status: AC
  Administered 2020-12-04: 4 mg via INTRAVENOUS
  Filled 2020-12-04: qty 2

## 2020-12-04 MED ORDER — IOHEXOL 300 MG/ML  SOLN
100.0000 mL | Freq: Once | INTRAMUSCULAR | Status: AC | PRN
  Administered 2020-12-04: 75 mL via INTRAVENOUS

## 2020-12-04 MED ORDER — KETOROLAC TROMETHAMINE 30 MG/ML IJ SOLN: 15.0000 mg | Freq: Once | INTRAMUSCULAR | Status: DC

## 2020-12-04 MED ORDER — ALUM & MAG HYDROXIDE-SIMETH 200-200-20 MG/5ML PO SUSP
30.0000 mL | Freq: Once | ORAL | Status: AC
  Administered 2020-12-04: 30 mL via ORAL
  Filled 2020-12-04: qty 30

## 2020-12-04 NOTE — ED Triage Notes (Signed)
Pt arrived via EMS for abdominal pain and nausea/vomiting. Pt had gallbladder surgery 2 weeks ago and stated she drank 3 beers tonight and started vomiting. Pt c/o 7/10 pain.

## 2020-12-04 NOTE — Discharge Instructions (Addendum)
Do not drink alcohol of any kind

## 2020-12-04 NOTE — ED Provider Notes (Addendum)
[facility] EMERGENCY DEPT Provider Note   CSN: [id number] Arrival date & time: 12/04/20  0301     History Chief Complaint  Patient presents with  . Abdominal Pain  . Nausea    Denise Davila is a 25 y.o. female.  The history is provided by the patient.  Abdominal Pain Pain location:  Epigastric and RUQ Pain quality: not aching   Pain radiates to:  Does not radiate Pain severity:  Severe Onset quality:  Gradual Timing:  Constant Progression:  Unchanged Chronicity:  Recurrent Context: alcohol use and retching   Context comment:  Has had this before with driking alcohol but is 2 weeks post op from laparoscopic cholecystectomy  Relieved by:  Nothing Worsened by:  Nothing Ineffective treatments:  None tried Associated symptoms: nausea and vomiting   Associated symptoms: no anorexia, no belching, no chest pain, no chills, no constipation, no cough, no diarrhea, no dysuria, no fever, no shortness of breath, no sore throat, no vaginal bleeding and no vaginal discharge   Risk factors: has not had multiple surgeries and not pregnant   Patient with visits to ED for RUQ pain post alcohol use presents with same.  The patient had a laparoscopic cholecystectomy performed on 11/19/20 for this pain.  No f/c/r.       Past Medical History:  Diagnosis Date  . Anxiety   . Chronic cholecystitis with calculus   . Depression   . Wears glasses     Patient Active Problem List   Diagnosis Date Noted  . Chronic calculous cholecystitis s/p lap cholecystectomy 11/19/2020 10/29/2020  . GAD (generalized anxiety disorder) 08/22/2015  . Depression 08/22/2015    Past Surgical History:  Procedure Laterality Date  . CHOLECYSTECTOMY    . LAPAROSCOPIC CHOLECYSTECTOMY SINGLE SITE WITH INTRAOPERATIVE CHOLANGIOGRAM N/A 11/19/2020   Procedure: SINGLE SITE LAPAROSCOPIC CHOLECYSTECTOMY SINGLE SITE WITH INTRAOPERATIVE CHOLANGIOGRAM;  Surgeon: Michael Boston, MD;  Location: [facility];  Service: General;  Laterality: N/A;  . NO PAST SURGERIES       OB History   No obstetric history on file.     Family History  Problem Relation Age of Onset  . Hyperlipidemia Father     Social History   Tobacco Use  . Smoking status: Former Smoker    Years: 1.00    Types: Cigarettes    Quit date: 11/15/2019    Years since quitting: 1.0  . Smokeless tobacco: Never Used  Vaping Use  . Vaping Use: Every day  . Devices: per unsure of name  Substance Use Topics  . Alcohol use: Yes    Alcohol/week: 24.0 standard drinks    Types: 24 Cans of beer per week    Comment: 11-14-2020  5th of liquor and two case's of beer per week  . Drug use: Never    Home Medications Prior to Admission medications   Medication Sig Start Date End Date Taking? Authorizing Provider  omeprazole (PRILOSEC) 20 MG capsule Take 1 capsule (20 mg total) by mouth daily. 12/04/20  Yes Raffaella Edison, MD  ondansetron (ZOFRAN) 4 MG tablet Take 1 tablet (4 mg total) by mouth every 8 (eight) hours as needed for nausea. 11/19/20   Michael Boston, MD  traMADol (ULTRAM) 50 MG tablet Take 1-2 tablets (50-100 mg total) by mouth every 6 (six) hours as needed for moderate pain or severe pain. 11/19/20   Michael Boston, MD    Allergies    Patient has no known allergies.  Review of  Systems   Review of Systems  Constitutional: Negative for chills and fever.  HENT: Negative for sore throat.   Eyes: Negative for visual disturbance.  Respiratory: Negative for cough and shortness of breath.   Cardiovascular: Negative for chest pain.  Gastrointestinal: Positive for abdominal pain, nausea and vomiting. Negative for anorexia, constipation and diarrhea.  Genitourinary: Negative for dysuria, vaginal bleeding and vaginal discharge.  Musculoskeletal: Negative for arthralgias.  Skin: Negative for rash.  Psychiatric/Behavioral: Negative for agitation.  All other systems reviewed and are negative.   Physical Exam Updated  Vital Signs BP 106/71 (BP Location: Right Arm)   Pulse 90   Temp 98.1 F (36.7 C) (Oral)   Resp 18   Ht 5\' 3"  (1.6 m)   Wt 56.7 kg   LMP 11/12/2020 (Exact Date)   SpO2 99%   BMI 22.14 kg/m   Physical Exam Vitals and nursing note reviewed. Exam conducted with a chaperone present.  Constitutional:      General: She is not in acute distress.    Appearance: Normal appearance.  HENT:     Head: Normocephalic and atraumatic.     Nose: Nose normal.  Eyes:     Conjunctiva/sclera: Conjunctivae normal.     Pupils: Pupils are equal, round, and reactive to light.  Cardiovascular:     Rate and Rhythm: Normal rate and regular rhythm.     Pulses: Normal pulses.     Heart sounds: Normal heart sounds.  Pulmonary:     Effort: Pulmonary effort is normal.     Breath sounds: Normal breath sounds.  Abdominal:     General: Abdomen is flat. Bowel sounds are normal.     Palpations: Abdomen is soft.     Tenderness: There is no abdominal tenderness. There is no guarding or rebound.     Hernia: No hernia is present.  Musculoskeletal:        General: Normal range of motion.     Cervical back: Normal range of motion and neck supple.  Skin:    General: Skin is warm and dry.     Capillary Refill: Capillary refill takes less than 2 seconds.  Neurological:     General: No focal deficit present.     Mental Status: She is alert and oriented to person, place, and time.     Deep Tendon Reflexes: Reflexes normal.  Psychiatric:        Mood and Affect: Mood normal.        Behavior: Behavior normal.     ED Results / Procedures / Treatments   Labs (all labs ordered are listed, but only abnormal results are displayed) Results for orders placed or performed during the hospital encounter of 12/04/20  CBC with Differential/Platelet  Result Value Ref Range   WBC 8.2 4.0 - 10.5 K/uL   RBC 4.02 3.87 - 5.11 MIL/uL   Hemoglobin 12.5 12.0 - 15.0 g/dL   HCT 37.3 36.0 - 46.0 %   MCV 92.8 80.0 - 100.0 fL   MCH  31.1 26.0 - 34.0 pg   MCHC 33.5 30.0 - 36.0 g/dL   RDW 11.9 11.5 - 15.5 %   Platelets 293 150 - 400 K/uL   nRBC 0.0 0.0 - 0.2 %   Neutrophils Relative % 61 %   Neutro Abs 5.0 1.7 - 7.7 K/uL   Lymphocytes Relative 29 %   Lymphs Abs 2.4 0.7 - 4.0 K/uL   Monocytes Relative 7 %   Monocytes Absolute 0.6 0.1 - 1.0  K/uL   Eosinophils Relative 2 %   Eosinophils Absolute 0.2 0.0 - 0.5 K/uL   Basophils Relative 1 %   Basophils Absolute 0.1 0.0 - 0.1 K/uL   Immature Granulocytes 0 %   Abs Immature Granulocytes 0.03 0.00 - 0.07 K/uL  Comprehensive metabolic panel  Result Value Ref Range   Sodium 138 135 - 145 mmol/L   Potassium 3.4 (L) 3.5 - 5.1 mmol/L   Chloride 103 98 - 111 mmol/L   CO2 26 22 - 32 mmol/L   Glucose, Bld 103 (H) 70 - 99 mg/dL   BUN 6 6 - 20 mg/dL   Creatinine, Ser 0.56 0.44 - 1.00 mg/dL   Calcium 8.8 (L) 8.9 - 10.3 mg/dL   Total Protein 7.6 6.5 - 8.1 g/dL   Albumin 4.6 3.5 - 5.0 g/dL   AST 24 15 - 41 U/L   ALT 18 0 - 44 U/L   Alkaline Phosphatase 43 38 - 126 U/L   Total Bilirubin 0.4 0.3 - 1.2 mg/dL   GFR, Estimated >60 >60 mL/min   Anion gap 9 5 - 15  Pregnancy, urine  Result Value Ref Range   Preg Test, Ur NEGATIVE NEGATIVE   DG Cholangiogram Operative  Result Date: 11/19/2020 CLINICAL DATA:  Intraoperative cholangiogram during laparoscopic cholecystectomy. EXAM: INTRAOPERATIVE CHOLANGIOGRAM FLUOROSCOPY TIME:  7 seconds COMPARISON:  None FINDINGS: Intraoperative cholangiographic images of the right upper abdominal quadrant during laparoscopic cholecystectomy are provided for review. Surgical clips overlie the expected location of the gallbladder fossa. Contrast injection demonstrates selective cannulation of the central aspect of the cystic duct. There is passage of contrast through the central aspect of the cystic duct with filling of a non dilated common bile duct. There is passage of contrast though the CBD and into the descending portion of the duodenum. There is  minimal reflux of injected contrast into the common hepatic duct and central aspect of the non dilated intrahepatic biliary system. There are no discrete filling defects within the opacified portions of the biliary system to suggest the presence of choledocholithiasis. IMPRESSION: No evidence of choledocholithiasis. Electronically Signed   By: Sandi Mariscal M.D.   On: 11/19/2020 13:02    Radiology No results found.  Procedures Procedures   Medications Ordered in ED Medications  ondansetron (ZOFRAN) injection 4 mg (4 mg Intravenous Given 12/04/20 0312)  alum & mag hydroxide-simeth (MAALOX/MYLANTA) 200-200-20 MG/5ML suspension 30 mL (30 mLs Oral Given 12/04/20 6295)    ED Course  I have reviewed the triage vital signs and the nursing notes.  Pertinent labs & imaging results that were available during my care of the patient were reviewed by me and considered in my medical decision making (see chart for details).   No emesis in the ED. Patient PO challenged successfully.     431 Case d/w Dr. Kae Heller of surgery via phone.  CT results viewed.  Not a post operative complication.  No indication for additional testing or treatment at this time.     This is not a surgical issue, nor is it an infectious issue.  Symptoms are due to recurrent alcohol use.  The only way to prevent recurrence of pain and emesis is not not use alcohol, at all.  Patient has been informed to abstain from all alcohol use.  I will prescribe a PPI.  Patient is stable for discharge with close follow up.    Patient informed of uterine fiborid and need for close follow up with OB GYN, patient verbalizes  understanding and agrees to follow up.  Denise Davila was evaluated in Emergency Department on 12/04/2020 for the symptoms described in the history of present illness. She was evaluated in the context of the global COVID-19 pandemic, which necessitated consideration that the patient might be at risk for infection with the SARS-CoV-2  virus that causes COVID-19. Institutional protocols and algorithms that pertain to the evaluation of patients at risk for COVID-19 are in a state of rapid change based on information released by regulatory bodies including the CDC and federal and state organizations. These policies and algorithms were followed during the patient's care in the ED.  Final Clinical Impression(s) / ED Diagnoses Final diagnoses:  Alcoholic gastritis without bleeding, unspecified chronicity   Return for intractable cough, coughing up blood, fevers >100.4 unrelieved by medication, shortness of breath, intractable vomiting, chest pain, shortness of breath, weakness, numbness, changes in speech, facial asymmetry, abdominal pain, passing out, Inability to tolerate liquids or food, cough, altered mental status or any concerns. No signs of systemic illness or infection. The patient is nontoxic-appearing on exam and vital signs are within normal limits.  I have reviewed the triage vital signs and the nursing notes. Pertinent labs & imaging results that were available during my care of the patient were reviewed by me and considered in my medical decision making (see chart for details). After history, exam, and medical workup I feel the patient has been appropriately medically screened and is safe for discharge home. Pertinent diagnoses were discussed with the patient. Patient was given return precautions. Rx / DC Orders ED Discharge Orders         Ordered    omeprazole (PRILOSEC) 20 MG capsule  Daily        12/04/20 0313           Zamyah Wiesman, MD 12/04/20 [id number]

## 2022-02-05 IMAGING — RF DG CHOLANGIOGRAM OPERATIVE
1 series · 4 of 4 positions shown · non-contrast
Comparison: None

CLINICAL DATA: Intraoperative cholangiogram during laparoscopic
cholecystectomy.

EXAM:
INTRAOPERATIVE CHOLANGIOGRAM
FLUOROSCOPY TIME:  7 seconds

[Series 1: run · 4 of 31 frames shown]
[frame 5/31]
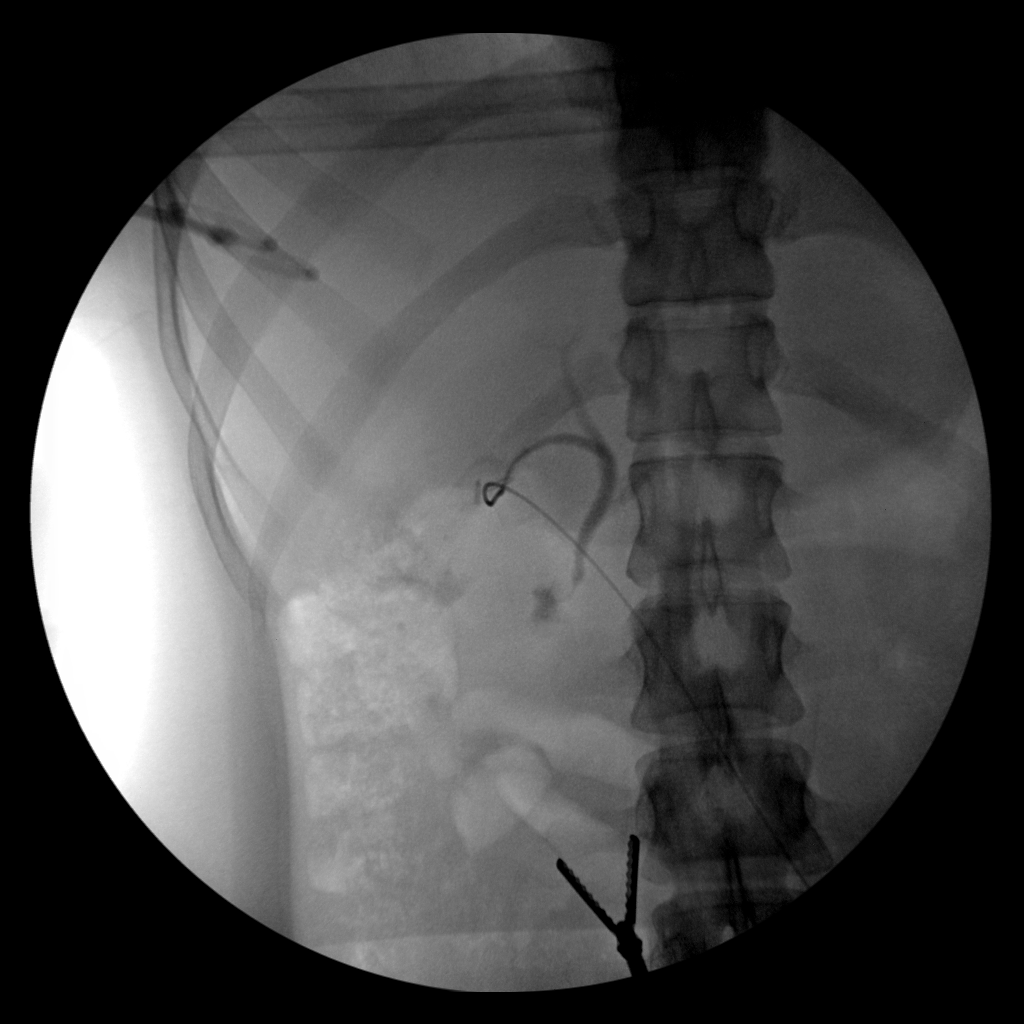
[frame 13/31]
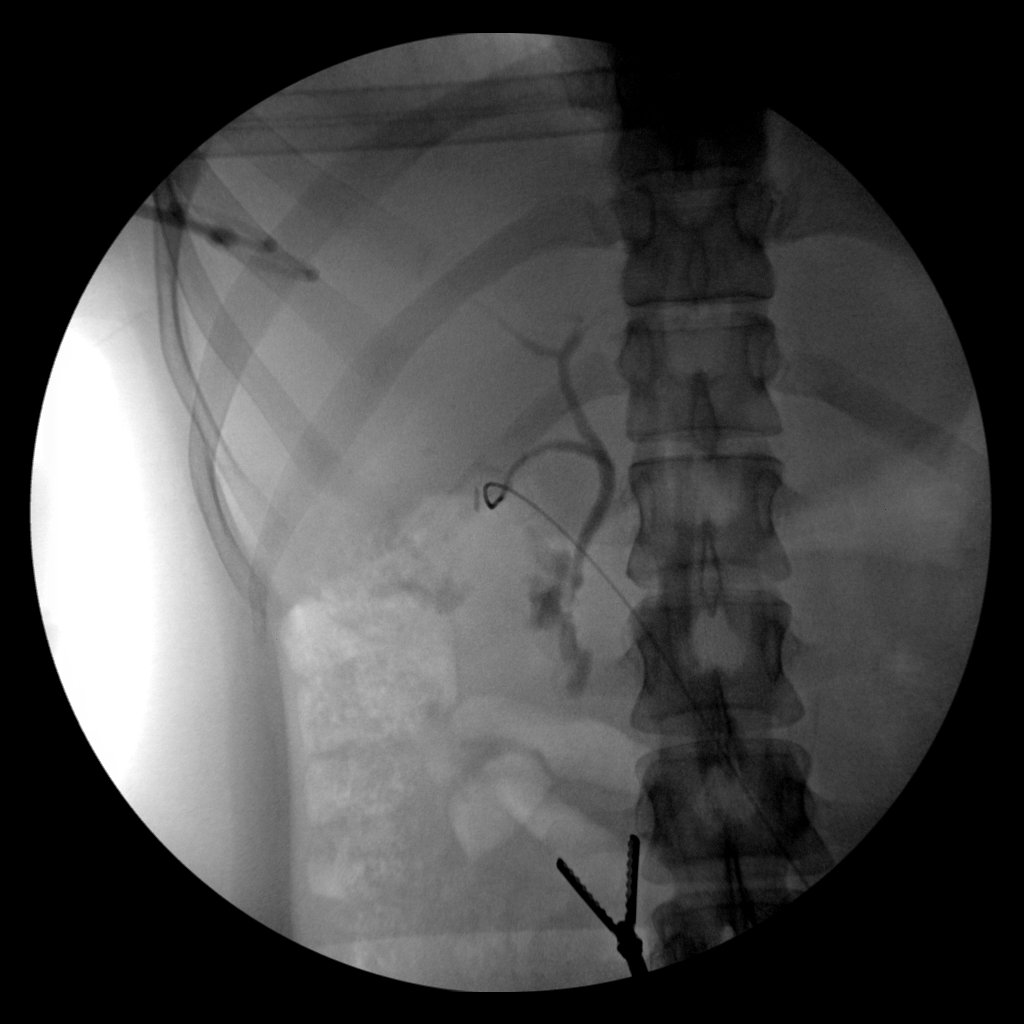
[frame 16/31]
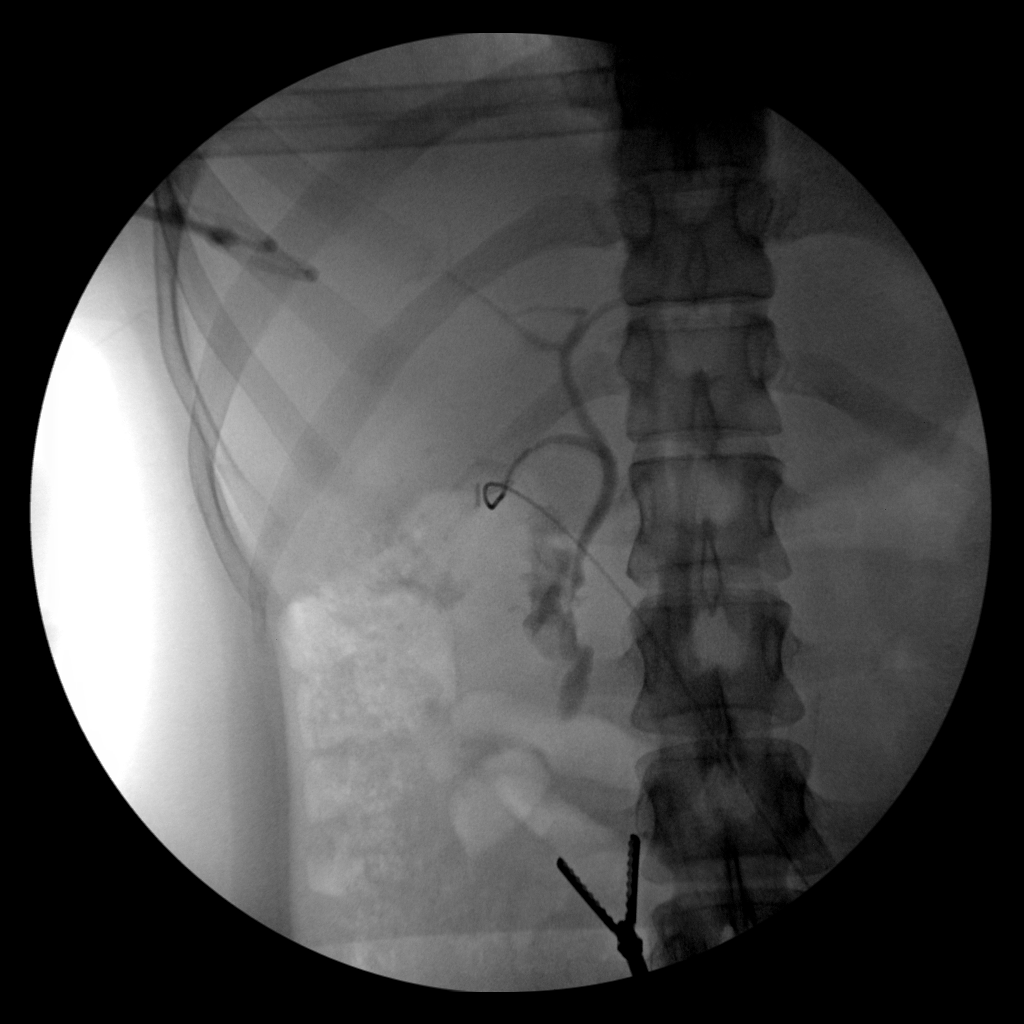
[frame 27/31]
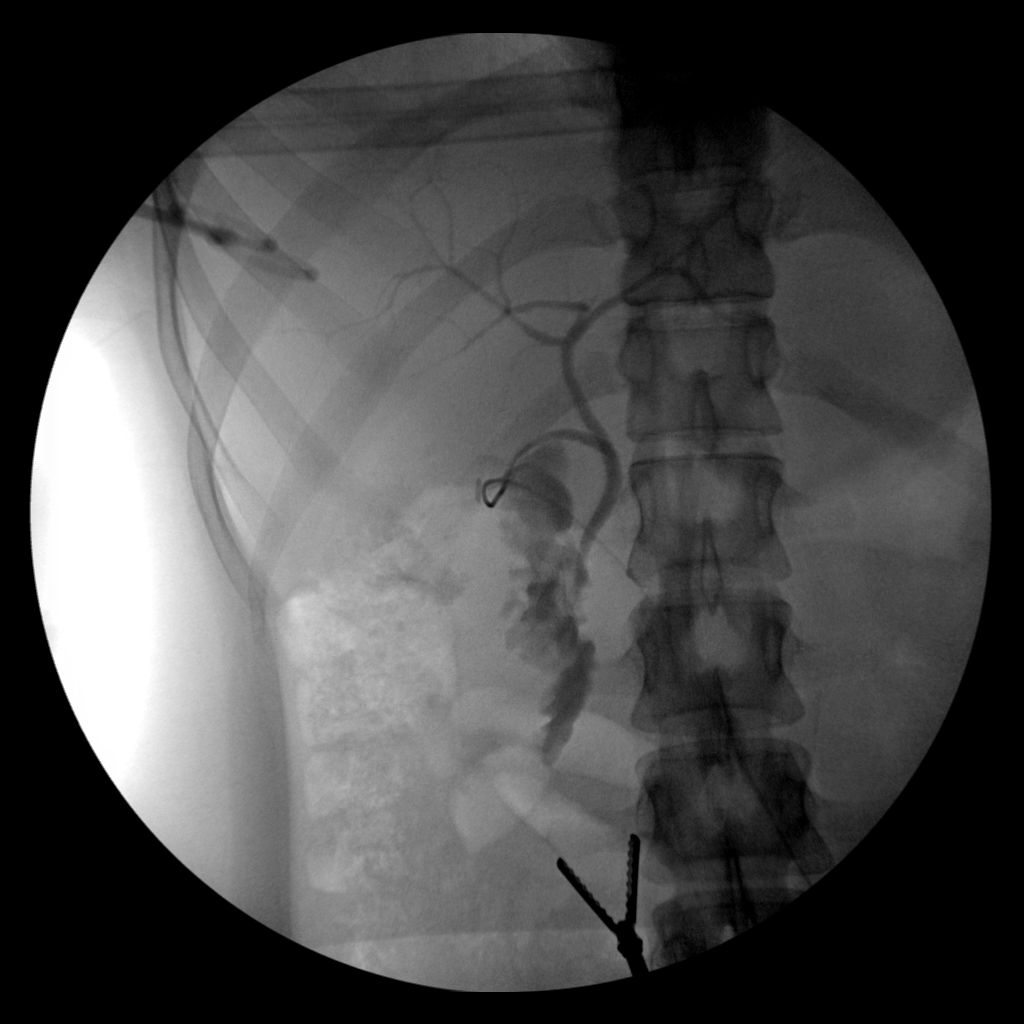

[4 of 4 positions shown; findings below may reference images not displayed]

FINDINGS: Intraoperative cholangiographic images of the right upper abdominal
quadrant during laparoscopic cholecystectomy are provided for
review.

Surgical clips overlie the expected location of the gallbladder
fossa.

Contrast injection demonstrates selective cannulation of the central
aspect of the cystic duct.

There is passage of contrast through the central aspect of the
cystic duct with filling of a non dilated common bile duct. There is
passage of contrast though the CBD and into the descending portion
of the duodenum.

There is minimal reflux of injected contrast into the common hepatic
duct and central aspect of the non dilated intrahepatic biliary
system.

There are no discrete filling defects within the opacified portions
of the biliary system to suggest the presence of
choledocholithiasis.
IMPRESSION: No evidence of choledocholithiasis.

## 2022-02-20 IMAGING — CT CT ABD-PELV W/ CM
2 of 4 series · 15 of 46 positions shown, 17 images · IV contrast (APPLIED)
Comparison: Intraoperative cholangiogram 11/19/2020

CLINICAL DATA: Nonlocalized acute abdominal pain.

EXAM:
CT ABDOMEN AND PELVIS WITH CONTRAST
TECHNIQUE: Multidetector CT imaging of the abdomen and pelvis was performed
using the standard protocol following bolus administration of
intravenous contrast.
CONTRAST:  75mL OMNIPAQUE IOHEXOL 300 MG/ML  SOLN

[Series 2: abd pel w · axial · 0.64mm/px · z∈[-922,-517]mm · 12 of 89 slices shown, 14 images]
[im 4/89  soft-tissue]
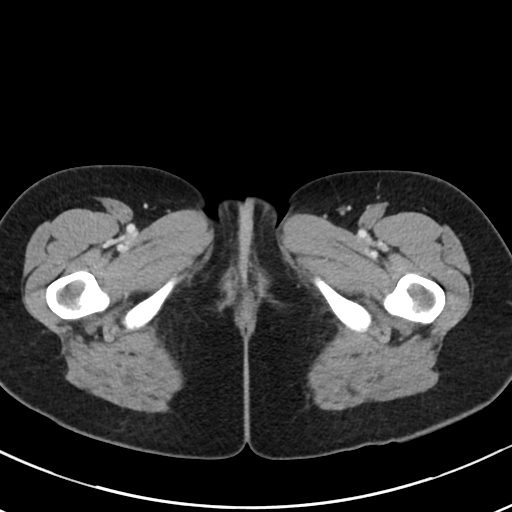
[im 4/89  bone]
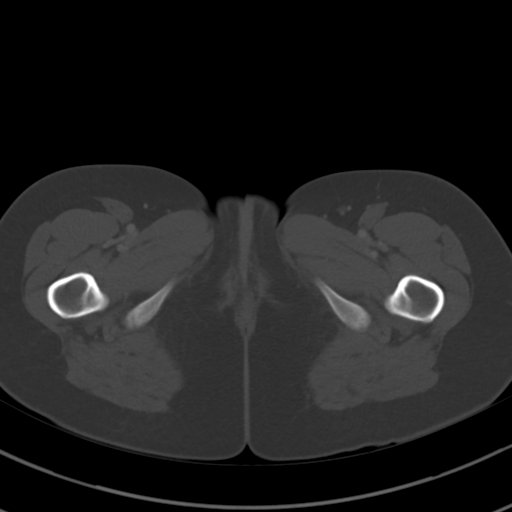
[im 12/89  soft-tissue]
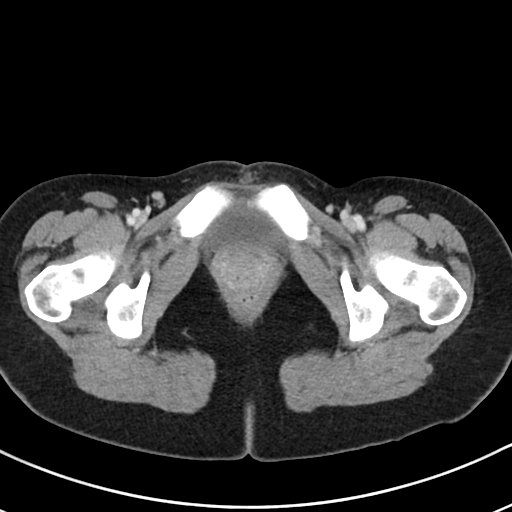
[im 20/89  soft-tissue]
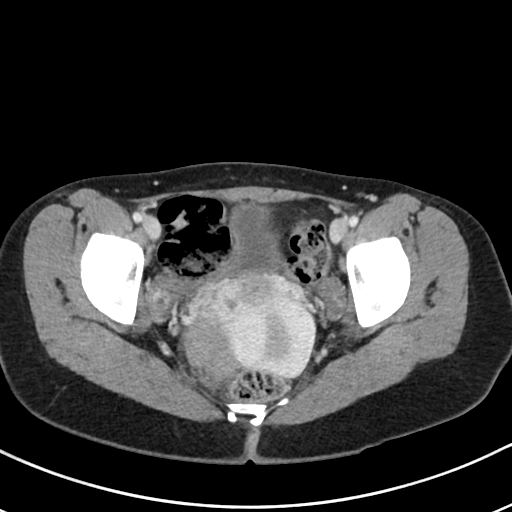
[im 27/89  soft-tissue]
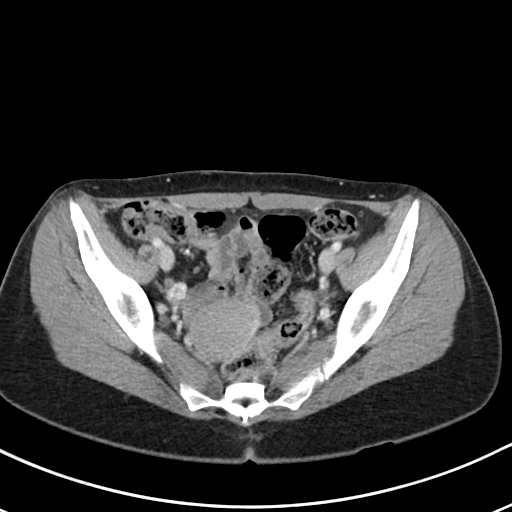
[im 35/89  soft-tissue]
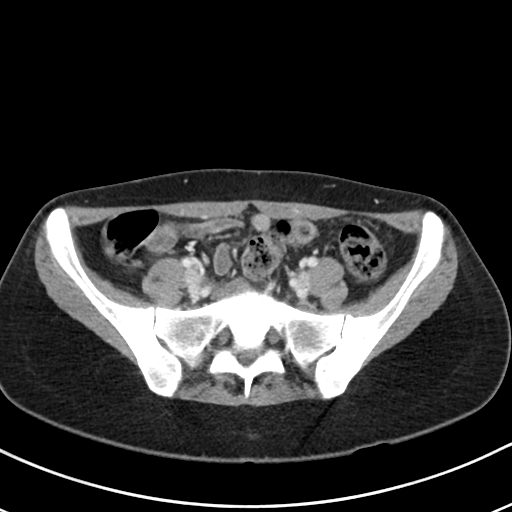
[im 43/89  soft-tissue]
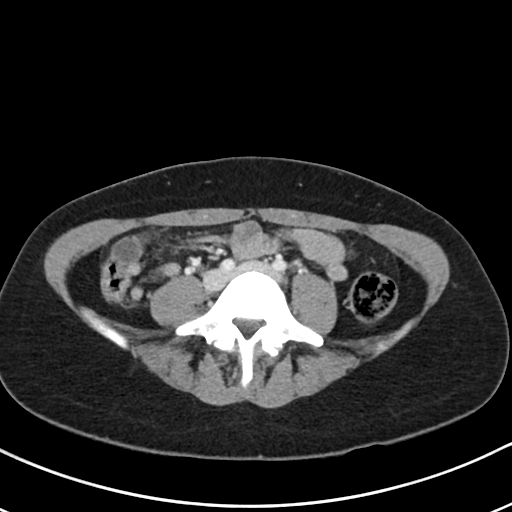
[im 46/89  soft-tissue]
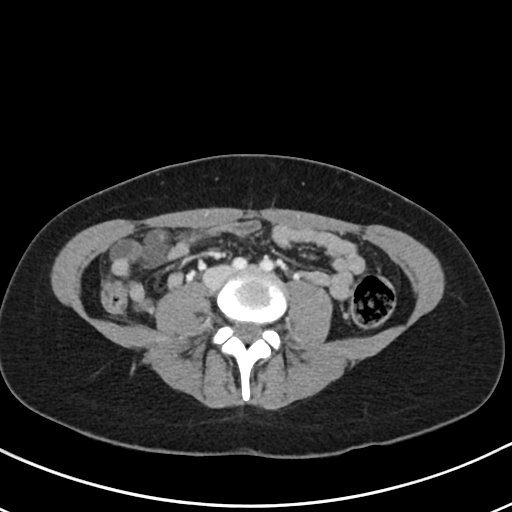
[im 54/89  soft-tissue]
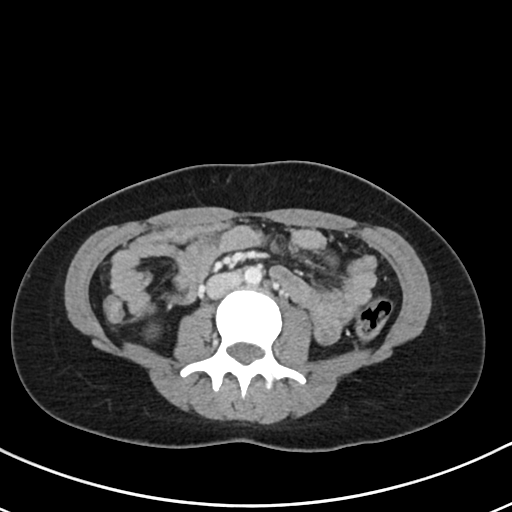
[im 62/89  soft-tissue]
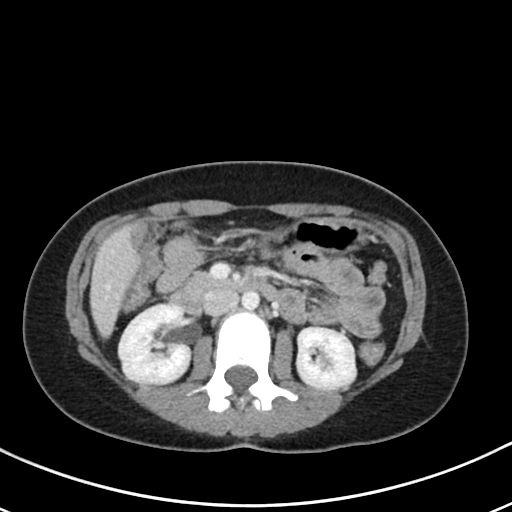
[im 62/89  bone]
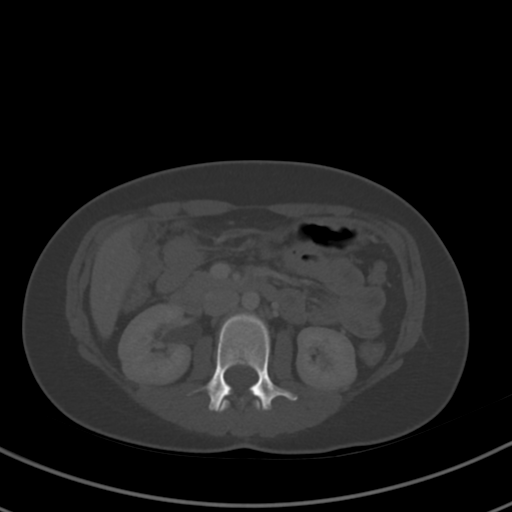
[im 69/89  soft-tissue]
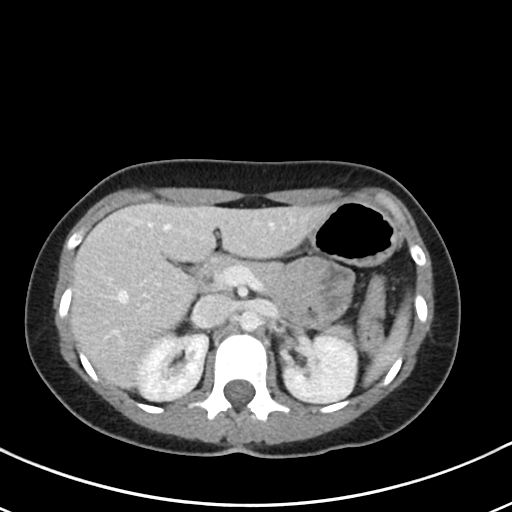
[im 77/89  soft-tissue]
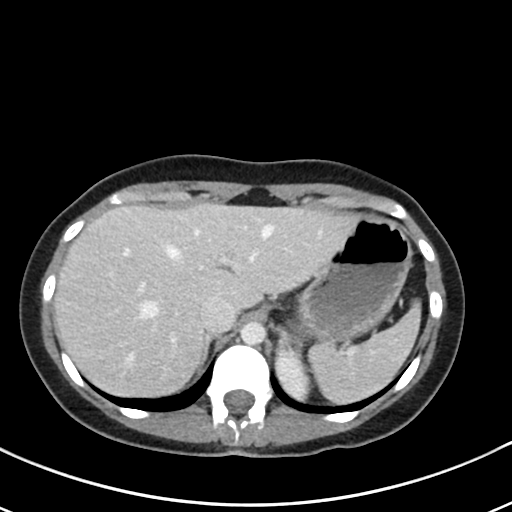
[im 85/89  soft-tissue]
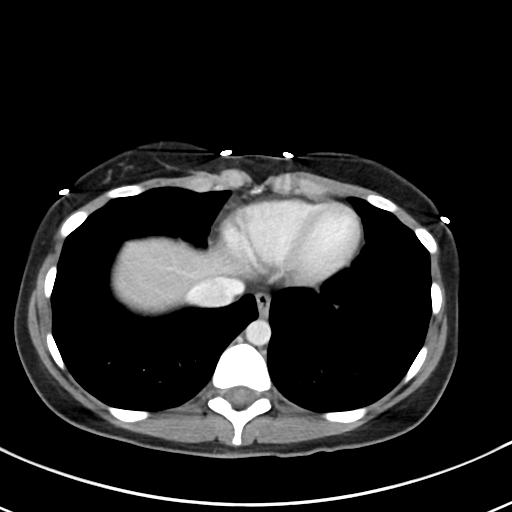

[Series 5: coronal · coronal · 0.62mm/px · 3 of 62 slices shown]
[im 21/62  soft-tissue]
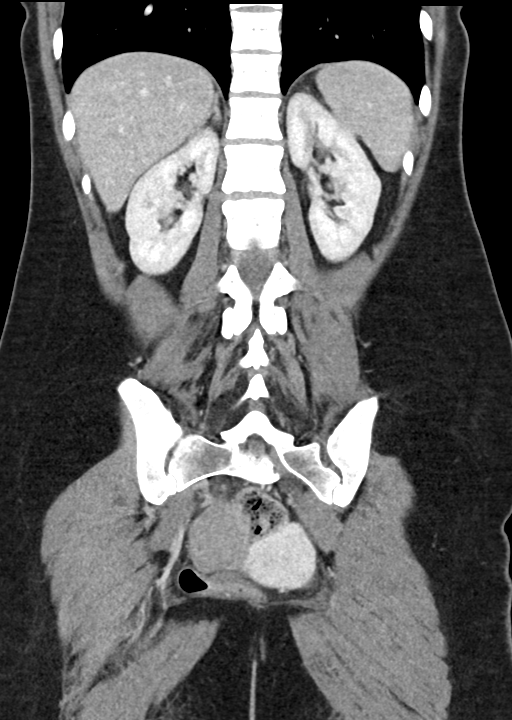
[im 28/62  soft-tissue]
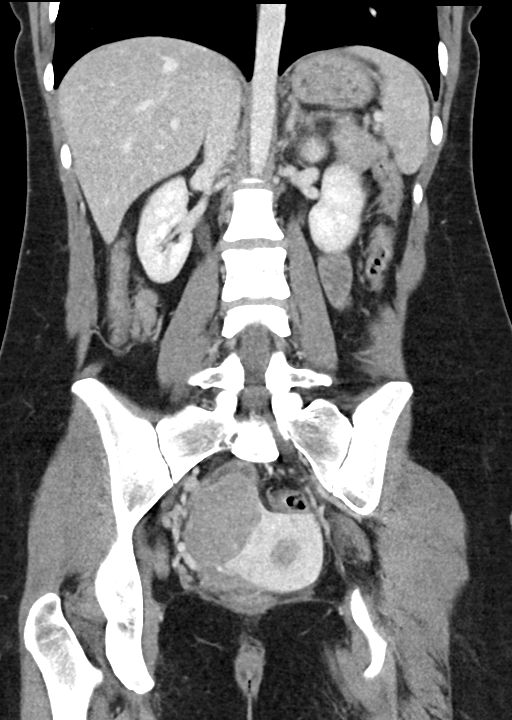
[im 34/62  soft-tissue]
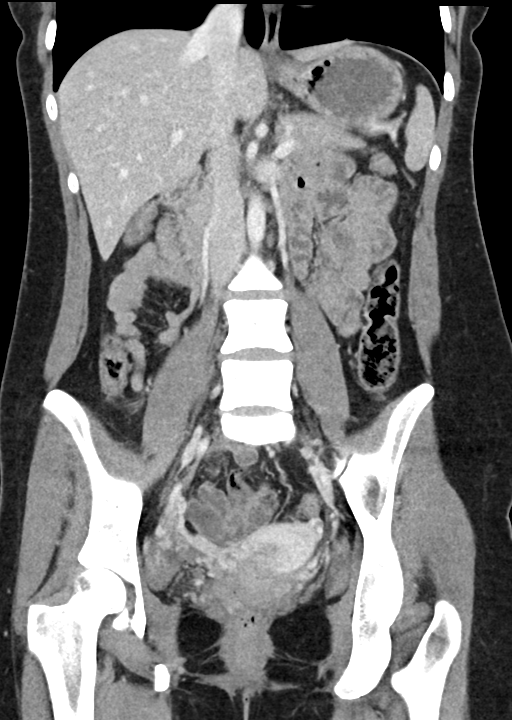

[15 of 46 positions shown; findings below may reference images not displayed]

FINDINGS: Lower chest: No acute abnormality.

Hepatobiliary: No focal liver abnormality. Status post
cholecystectomy with vague subcapsular 3 mm hypodensity of the right
inferior hepatic lobe along the gallbladder fossa. No biliary
dilatation.

Pancreas: No focal lesion. Normal pancreatic contour. No surrounding
inflammatory changes. No main pancreatic ductal dilatation.

Spleen: Normal in size without focal abnormality.

Adrenals/Urinary Tract:

No adrenal nodule bilaterally.

Bilateral kidneys enhance symmetrically. No hydronephrosis. No
hydroureter.

The urinary bladder is unremarkable.

Stomach/Bowel: Stomach is within normal limits. No evidence of bowel
wall thickening or dilatation. Appendix appears normal.

Vascular/Lymphatic: No significant vascular findings are present. No
enlarged abdominal or pelvic lymph nodes.

Reproductive: Retroverted uterus. A 4.9 x 4.6 cm right adnexal
density that is likely arising from the uterus ([DATE], [DATE]). The
right ovary is noted lobe antral laterally to this mass. A corpus
luteum cyst is noted within the right ovary ([DATE], [DATE]). Grossly
unremarkable left ovary ([DATE]).

Other: No intraperitoneal free fluid. No intraperitoneal free gas.
No organized fluid collection.

Musculoskeletal: No acute or significant osseous findings.
IMPRESSION: 1. A 4.9 x 4.6 cm right adnexal density that is likely arising from
the uterus. Finding could represent a uterine fibroid.
2. Status post cholecystectomy with nonspecific vague subcapsular 3
mm hypodensity of the right inferior hepatic lobe along the
gallbladder fossa. Finding could be postsurgical. No abscess
formation.

## 2023-04-11 ENCOUNTER — Emergency Department (HOSPITAL_BASED_OUTPATIENT_CLINIC_OR_DEPARTMENT_OTHER)
Admission: EM | Admit: 2023-04-11 | Discharge: 2023-04-11 | Disposition: A | Discharge status: Home / Self Care | Payer: Self-pay | Attending: Emergency Medicine | Admitting: Emergency Medicine

## 2023-04-11 ENCOUNTER — Other Ambulatory Visit: Payer: Self-pay

## 2023-04-11 ENCOUNTER — Emergency Department (HOSPITAL_BASED_OUTPATIENT_CLINIC_OR_DEPARTMENT_OTHER): Payer: Self-pay

## 2023-04-11 ENCOUNTER — Encounter (HOSPITAL_BASED_OUTPATIENT_CLINIC_OR_DEPARTMENT_OTHER): Payer: Self-pay | Admitting: *Deleted

## 2023-04-11 DIAGNOSIS — S62316A Displaced fracture of base of fifth metacarpal bone, right hand, initial encounter for closed fracture: Secondary | ICD-10-CM | POA: Insufficient documentation

## 2023-04-11 DIAGNOSIS — W2209XA Striking against other stationary object, initial encounter: Secondary | ICD-10-CM | POA: Insufficient documentation

## 2023-04-11 DIAGNOSIS — M79641 Pain in right hand: Secondary | ICD-10-CM | POA: Diagnosis present

## 2023-04-11 MED ORDER — OXYCODONE-ACETAMINOPHEN 5-325 MG PO TABS
1.0000 | ORAL_TABLET | Freq: Once | ORAL | Status: AC
  Administered 2023-04-11: 1 via ORAL
  Filled 2023-04-11: qty 1

## 2023-04-11 MED ORDER — OXYCODONE-ACETAMINOPHEN 5-325 MG PO TABS: 1.0000 | ORAL_TABLET | Freq: Four times a day (QID) | ORAL | 0 refills | Status: AC | PRN

## 2023-04-11 MED ORDER — KETOROLAC TROMETHAMINE 60 MG/2ML IM SOLN
30.0000 mg | Freq: Once | INTRAMUSCULAR | Status: AC
  Administered 2023-04-11: 30 mg via INTRAMUSCULAR
  Filled 2023-04-11: qty 2

## 2023-04-11 MED ORDER — OXYCODONE-ACETAMINOPHEN 5-325 MG PO TABS: 1.0000 | ORAL_TABLET | Freq: Four times a day (QID) | ORAL | 0 refills | Status: DC | PRN

## 2023-04-11 NOTE — ED Notes (Signed)
Ice pack given
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
